# Patient Record
Sex: Female | Born: 1940 | Race: White | Hispanic: No | Marital: Married | Smoking: Former smoker
Health system: Southern US, Community
[De-identification: ages and names within clinical notes are randomized; demographics above are authoritative.]

## PROBLEM LIST (undated history)

## (undated) DIAGNOSIS — R7989 Other specified abnormal findings of blood chemistry: Secondary | ICD-10-CM

## (undated) DIAGNOSIS — F909 Attention-deficit hyperactivity disorder, unspecified type: Secondary | ICD-10-CM

## (undated) DIAGNOSIS — K8020 Calculus of gallbladder without cholecystitis without obstruction: Secondary | ICD-10-CM

## (undated) DIAGNOSIS — F418 Other specified anxiety disorders: Secondary | ICD-10-CM

## (undated) DIAGNOSIS — R945 Abnormal results of liver function studies: Secondary | ICD-10-CM

## (undated) DIAGNOSIS — R42 Dizziness and giddiness: Secondary | ICD-10-CM

## (undated) DIAGNOSIS — I8390 Asymptomatic varicose veins of unspecified lower extremity: Secondary | ICD-10-CM

## (undated) DIAGNOSIS — F4310 Post-traumatic stress disorder, unspecified: Secondary | ICD-10-CM

## (undated) DIAGNOSIS — R413 Other amnesia: Secondary | ICD-10-CM

## (undated) DIAGNOSIS — K219 Gastro-esophageal reflux disease without esophagitis: Secondary | ICD-10-CM

## (undated) DIAGNOSIS — I469 Cardiac arrest, cause unspecified: Secondary | ICD-10-CM

## (undated) DIAGNOSIS — C4491 Basal cell carcinoma of skin, unspecified: Secondary | ICD-10-CM

## (undated) DIAGNOSIS — T148XXA Other injury of unspecified body region, initial encounter: Secondary | ICD-10-CM

## (undated) HISTORY — PX: VARICOSE VEIN SURGERY: SHX832

## (undated) HISTORY — DX: Calculus of gallbladder without cholecystitis without obstruction: K80.20

## (undated) HISTORY — DX: Other amnesia: R41.3

## (undated) HISTORY — DX: Other specified abnormal findings of blood chemistry: R79.89

## (undated) HISTORY — DX: Attention-deficit hyperactivity disorder, unspecified type: F90.9

## (undated) HISTORY — PX: MOHS SURGERY: SHX181

## (undated) HISTORY — DX: Post-traumatic stress disorder, unspecified: F43.10

## (undated) HISTORY — PX: BACK SURGERY: SHX140

## (undated) HISTORY — DX: Other specified anxiety disorders: F41.8

## (undated) HISTORY — PX: ABDOMINAL HYSTERECTOMY: SHX81

## (undated) HISTORY — DX: Gastro-esophageal reflux disease without esophagitis: K21.9

## (undated) HISTORY — DX: Abnormal results of liver function studies: R94.5

## (undated) HISTORY — DX: Asymptomatic varicose veins of unspecified lower extremity: I83.90

## (undated) HISTORY — DX: Basal cell carcinoma of skin, unspecified: C44.91

## (undated) HISTORY — PX: CATARACT EXTRACTION: SUR2

## (undated) HISTORY — DX: Dizziness and giddiness: R42

---

## 1979-10-13 HISTORY — PX: CHOLECYSTECTOMY: SHX55

## 1982-10-12 HISTORY — PX: LIVER SURGERY: SHX698

## 2010-01-02 ENCOUNTER — Encounter: Payer: Self-pay | Admitting: Emergency Medicine

## 2010-01-02 ENCOUNTER — Observation Stay (HOSPITAL_COMMUNITY): Admission: EM | Admit: 2010-01-02 | Discharge: 2010-01-03 | Payer: Self-pay | Admitting: Internal Medicine

## 2010-01-02 ENCOUNTER — Ambulatory Visit: Payer: Self-pay | Admitting: Radiology

## 2010-01-02 ENCOUNTER — Ambulatory Visit: Payer: Self-pay | Admitting: Cardiology

## 2010-01-03 ENCOUNTER — Encounter: Payer: Self-pay | Admitting: Urology

## 2010-01-03 ENCOUNTER — Encounter (INDEPENDENT_AMBULATORY_CARE_PROVIDER_SITE_OTHER): Payer: Self-pay | Admitting: Internal Medicine

## 2010-01-03 ENCOUNTER — Ambulatory Visit: Payer: Self-pay | Admitting: Vascular Surgery

## 2011-01-05 LAB — LIPID PANEL
HDL: 46 mg/dL (ref >39)
LDL Cholesterol: 112 mg/dL — ABNORMAL HIGH (ref 0–99)
Total CHOL/HDL Ratio: 3.7 RATIO
Triglycerides: 51 mg/dL (ref <150)

## 2011-01-05 LAB — PROTIME-INR: Prothrombin Time: 13.2 seconds (ref 11.6–15.2)

## 2011-01-05 LAB — COMPREHENSIVE METABOLIC PANEL
ALT: 22 U/L (ref 0–35)
ALT: 29 U/L (ref 0–35)
Albumin: 3.6 g/dL (ref 3.5–5.2)
Alkaline Phosphatase: 85 U/L (ref 39–117)
CO2: 30 mEq/L (ref 19–32)
Calcium: 8.7 mg/dL (ref 8.4–10.5)
Chloride: 107 mEq/L (ref 96–112)
Creatinine, Ser: 0.66 mg/dL (ref 0.4–1.2)
Creatinine, Ser: 0.7 mg/dL (ref 0.4–1.2)
GFR calc non Af Amer: >60 mL/min (ref >60)
Glucose, Bld: 100 mg/dL — ABNORMAL HIGH (ref 70–99)
Sodium: 143 mEq/L (ref 135–145)
Sodium: 146 mEq/L — ABNORMAL HIGH (ref 135–145)
Total Bilirubin: 0.6 mg/dL (ref 0.3–1.2)
Total Protein: 6.5 g/dL (ref 6.0–8.3)
Total Protein: 7.2 g/dL (ref 6.0–8.3)

## 2011-01-05 LAB — CBC
HCT: 39.2 % (ref 36.0–46.0)
Hemoglobin: 13.7 g/dL (ref 12.0–15.0)
MCHC: 33.9 g/dL (ref 30.0–36.0)
MCV: 98.2 fL (ref 78.0–100.0)
Platelets: 210 10*3/uL (ref 150–400)
Platelets: 214 10*3/uL (ref 150–400)
RBC: 4.37 MIL/uL (ref 3.87–5.11)
WBC: 7 10*3/uL (ref 4.0–10.5)
WBC: 9 10*3/uL (ref 4.0–10.5)

## 2011-01-05 LAB — DIFFERENTIAL
Eosinophils Absolute: 0.1 10*3/uL (ref 0.0–0.7)
Eosinophils Relative: 1 % (ref 0–5)
Lymphs Abs: 1.1 10*3/uL (ref 0.7–4.0)
Monocytes Absolute: 0.4 10*3/uL (ref 0.1–1.0)
Neutro Abs: 3.4 10*3/uL (ref 1.7–7.7)
Neutro Abs: 7.4 10*3/uL (ref 1.7–7.7)

## 2011-01-05 LAB — CARDIAC PANEL(CRET KIN+CKTOT+MB+TROPI)
CK, MB: 2.8 ng/mL (ref 0.3–4.0)
Total CK: 742 U/L — ABNORMAL HIGH (ref 7–177)
Troponin I: <0.01 ng/mL (ref 0.00–0.06)

## 2011-01-05 LAB — BRAIN NATRIURETIC PEPTIDE: Pro B Natriuretic peptide (BNP): 103 pg/mL — ABNORMAL HIGH (ref 0.0–100.0)

## 2011-01-05 LAB — URINALYSIS, ROUTINE W REFLEX MICROSCOPIC
Bilirubin Urine: NEGATIVE
Ketones, ur: NEGATIVE mg/dL

## 2011-01-05 LAB — PHOSPHORUS: Phosphorus: 3.4 mg/dL (ref 2.3–4.6)

## 2011-03-31 ENCOUNTER — Encounter: Payer: Self-pay | Admitting: Internal Medicine

## 2011-05-13 ENCOUNTER — Ambulatory Visit (INDEPENDENT_AMBULATORY_CARE_PROVIDER_SITE_OTHER): Payer: Medicare Other | Admitting: Internal Medicine

## 2011-05-13 ENCOUNTER — Encounter: Payer: Self-pay | Admitting: Internal Medicine

## 2011-05-13 DIAGNOSIS — K219 Gastro-esophageal reflux disease without esophagitis: Secondary | ICD-10-CM

## 2011-05-13 DIAGNOSIS — R1310 Dysphagia, unspecified: Secondary | ICD-10-CM

## 2011-05-13 NOTE — Patient Instructions (Signed)
EGD [facility] 05/15/11 1:30 pm arrive at 12:30 pm on 4th floor Endoscopy brochure given along with GERD information for you to read. Dexilant 60 mg prescription sent to pharmacy and samples given.  Take 1 by mouth 30 mins. Prior to first meal.

## 2011-05-15 ENCOUNTER — Encounter: Payer: Self-pay | Admitting: Internal Medicine

## 2011-05-15 ENCOUNTER — Ambulatory Visit (AMBULATORY_SURGERY_CENTER): Payer: Medicare Other | Admitting: Internal Medicine

## 2011-05-15 VITALS — BP 143/67 | HR 56 | Temp 98.0°F | Resp 14 | Ht 65.0 in | Wt 135.0 lb

## 2011-05-15 DIAGNOSIS — K219 Gastro-esophageal reflux disease without esophagitis: Secondary | ICD-10-CM

## 2011-05-15 DIAGNOSIS — R1310 Dysphagia, unspecified: Secondary | ICD-10-CM

## 2011-05-15 MED ORDER — SODIUM CHLORIDE 0.9 % IV SOLN: 500.0000 mL | INTRAVENOUS | Status: DC

## 2011-05-15 NOTE — Progress Notes (Signed)
HISTORY OF PRESENT ILLNESS:  Autumn Lawson is a 70 y.o. female with the below listed medical history who presents today regarding reflux disease and dysphagia. Her history is remarkable for complicated stone-related biliary disease remotely for which she apparently underwent choledocho duodenostomy and hepaticojejunostomy in addition to repeat ERCPs (all at [facility]). No problems for years and that regard. Also, has had repeat screening colonoscopies in [facility]. She denies polyps. Her current history, for which she is referred by her primary provider, is that of 2 years of pharyngeal burning exacerbated by certain food items such as spicy food and wine. This seems to be low-grade chronic burning sensation. She take Prilosec 20 mg daily. She uses antacids on command which afford some relief. She also reports a 2 year history of intermittent solid food dysphagia. No food impaction. For these problems, she was evaluated at [facility]. PH study suggested. She was not interested. I do have a portion of upper endoscopy report from [place] date 03/21/2009. Telemetry had gastritis as well as surgically altered anatomy. Biopsies did not show H. pylori.  REVIEW OF SYSTEMS:  All non-GI ROS negative except for  Past Medical History  Diagnosis Date  . ADD (attention deficit disorder with hyperactivity)   . Depression with anxiety   . Vertigo   . PTSD (post-traumatic stress disorder)   . Esophageal reflux   . Gallstone   . Skin cancer, basal cell   . Elevated LFTs     Past Surgical History  Procedure Date  . Varicose vein surgery   . Cesarean section     x4  . Cataract extraction     bilateral  . Abdominal hysterectomy   . Back surgery     rod in back placed  . Liver surgery 1984  . Cholecystectomy 1981    Social History Autumn Lawson  reports that she has quit smoking. She does not have any smokeless tobacco history on file. She reports that she does not drink  alcohol or use illicit drugs.  family history includes Alzheimer's disease in her mother; Leukemia in her father; and Prostate cancer in her other.  Allergies  Allergen Reactions  . Red Wine Extract        PHYSICAL EXAMINATION: Vital signs: BP 140/70  Pulse 60  Ht 5\' 5"  (1.651 m)  Wt 135 lb 6.4 oz (61.417 kg)  BMI 22.53 kg/m2  Constitutional: generally well-appearing, no acute distress Psychiatric: alert and oriented x3, cooperative Eyes: extraocular movements intact, anicteric, conjunctiva pink Mouth: oral pharynx moist, no lesions Neck: supple no lymphadenopathy Cardiovascular: heart regular rate and rhythm, no murmur Lungs: clear to auscultation bilaterally Abdomen: soft, nontender, nondistended, no obvious ascites, no peritoneal signs, normal bowel sounds, no organomegaly. Surgical incisions well-healed Extremities: no lower extremity edema bilaterally Skin: no lesions on visible extremities Neuro: No focal deficits.   ASSESSMENT:  #1. Intermittent solid food dysphagia. Rule out stricture or ring #2. Pharyngeal burning and substernal burning despite Prilosec 20 mg daily. Some improvement with antacids. Query GERD #3. Remote history of complicated biliary stone disease status post choledochoduodenostomy, hepaticojejunostomy, multiple ERCPs #4. Colon cancer screening. Prior colonoscopies in [place]   PLAN:  #1. Reflux precautions #2. Change from Prilosec to Dexilant 60 mg daily. Samples provided and a prescription submitted electronically #3. Schedule upper endoscopy with probable esophageal dilation.The nature of the procedure, as well as the risks, benefits, and alternatives were carefully and thoroughly reviewed with the patient. Ample time for discussion and questions  allowed. The patient understood, was satisfied, and agreed to proceed.  #4. Release form signed to obtain outside records for review

## 2011-05-15 NOTE — Progress Notes (Signed)
No complaints on discharge.  MAW 

## 2011-05-15 NOTE — Patient Instructions (Signed)
Refer to the picture page for the findings of the upper endoscopy.  Please follow the esophageal dilation diet the remainder of the day.  Please follow the discharge instructions on the green and blue sheets the rest of the day also.  Resume your prior medications today.  Call if any questions or concerns.

## 2011-05-16 ENCOUNTER — Other Ambulatory Visit: Payer: Self-pay | Admitting: Internal Medicine

## 2011-05-16 MED ORDER — DEXLANSOPRAZOLE 60 MG PO CPDR: 60.0000 mg | DELAYED_RELEASE_CAPSULE | Freq: Every day | ORAL | Status: AC

## 2011-05-16 NOTE — Telephone Encounter (Signed)
dexilant not at pharmacy Asking me to send Rx to [facility] in [facility] Will do 1 month She will need further refills via local pharmacy

## 2011-05-17 NOTE — Telephone Encounter (Signed)
Britta Mccreedy, I thought that you submitted a prescription for Dexilant to this patient's pharmacy, as I had directed in the office -  And as indicated in your patient instructions... Please clarify. Also, send 6 refills - if not already done (see Dr. Marvell Fuller note from this weekend). Thanks.

## 2011-05-18 ENCOUNTER — Telehealth: Payer: Self-pay | Admitting: *Deleted

## 2011-05-18 NOTE — Telephone Encounter (Signed)
Called number in computer and no answer with no identifiers so no message left. EWM

## 2011-05-18 NOTE — Telephone Encounter (Signed)
Rx. Was sent to pharmacy for Dexilant while patient was in the office.  #30 with no refills when it should have been 6 RFs,  Dexilant resent with 6 RFs.

## 2011-05-19 ENCOUNTER — Telehealth: Payer: Self-pay | Admitting: Internal Medicine

## 2011-05-19 NOTE — Telephone Encounter (Signed)
Forwarded to Dr. Marina Goodell for review. Patient's appt on 06-22-11

## 2011-05-28 ENCOUNTER — Telehealth: Payer: Self-pay | Admitting: Internal Medicine

## 2011-05-28 NOTE — Telephone Encounter (Signed)
Received copies from Dr. Donnal Debar- Dept. Of Gastroenterology at [facility] on 05/28/11. Forwarded  5pages to Dr.Perry for review.

## 2011-06-22 ENCOUNTER — Other Ambulatory Visit: Payer: Self-pay | Admitting: *Deleted

## 2011-06-22 ENCOUNTER — Ambulatory Visit: Payer: Medicare Other | Admitting: Internal Medicine

## 2011-06-22 MED ORDER — DEXLANSOPRAZOLE 60 MG PO CPDR: 60.0000 mg | DELAYED_RELEASE_CAPSULE | Freq: Every day | ORAL | Status: AC

## 2011-06-22 NOTE — Telephone Encounter (Signed)
Dexilant sent to pharmacy 

## 2011-07-02 ENCOUNTER — Encounter: Payer: Self-pay | Admitting: Internal Medicine

## 2011-07-02 ENCOUNTER — Ambulatory Visit (INDEPENDENT_AMBULATORY_CARE_PROVIDER_SITE_OTHER): Payer: Medicare Other | Admitting: Internal Medicine

## 2011-07-02 DIAGNOSIS — Z1211 Encounter for screening for malignant neoplasm of colon: Secondary | ICD-10-CM

## 2011-07-02 DIAGNOSIS — J312 Chronic pharyngitis: Secondary | ICD-10-CM

## 2011-07-02 DIAGNOSIS — K219 Gastro-esophageal reflux disease without esophagitis: Secondary | ICD-10-CM

## 2011-07-02 DIAGNOSIS — R1310 Dysphagia, unspecified: Secondary | ICD-10-CM

## 2011-07-02 NOTE — Patient Instructions (Signed)
Follow up as needed

## 2011-07-02 NOTE — Progress Notes (Signed)
HISTORY OF PRESENT ILLNESS:  Autumn Lawson is a 70 y.o. female with the below listed medical history who was evaluated 05/13/2011 regarding possible reflux disease and dysphagia. See that dictation for details. She subsequently underwent upper endoscopy on 05/15/2011. The examination was normal except for the presence of a small sliding hiatal hernia and evidence of prior choledochoduodenostomy. The esophagus was empirically dilated with a 54 [place] Maloney dilator. She was placed on Dexilant 60 mg daily and asked to followup at this time. Since her procedure, she still complains of the throat burning sensation. She describes "swollen glands". She was compliant with Dexilant therapy. Her intermittent solid food dysphagia has resolved. Unable to obtain old colonoscopy records, but reports on prior examinations, no polyps. She estimates most recent examination 3 years ago. She wishes to have future surveillance exams here.  REVIEW OF SYSTEMS:  All non-GI ROS negative.  Past Medical History  Diagnosis Date  . ADD (attention deficit disorder with hyperactivity)   . Depression with anxiety   . Vertigo   . PTSD (post-traumatic stress disorder)   . Esophageal reflux   . Gallstone   . Skin cancer, basal cell   . Elevated LFTs     Past Surgical History  Procedure Date  . Varicose vein surgery   . Cesarean section     x4  . Cataract extraction     bilateral  . Abdominal hysterectomy   . Back surgery     rod in back placed  . Liver surgery 1984  . Cholecystectomy 1981    Social History Autumn Lawson  reports that she quit smoking about 35 years ago. She does not have any smokeless tobacco history on file. She reports that she does not drink alcohol or use illicit drugs.  family history includes Alzheimer's disease in her mother; Leukemia in her father; and Prostate cancer in her other.  Allergies  Allergen Reactions  . Red Wine Extract        PHYSICAL EXAMINATION:  Vital signs:  BP 112/68  Pulse 57  Ht 5\' 5"  (1.651 m)  Wt 134 lb (60.782 kg)  BMI 22.30 kg/m2  SpO2 98% General: Well-developed, well-nourished, no acute distress HEENT: Sclerae are anicteric, conjunctiva pink. Oral mucosa intact Lungs: Clear Heart: Regular Abdomen: soft, nontender, nondistended, no obvious ascites, no peritoneal signs, normal bowel sounds. No organomegaly. Extremities: No edema Psychiatric: alert and oriented x3. Cooperative    ASSESSMENT:  #1. 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 No evidence to support GERD a history, endoscopic exam, or response to appropriate therapy. #2. Intermittent solid food dysphagia. Improve post dilation. Suspect subtle ring or web adequately treated with recent dilation. #3. Query GERD #4. Colon cancer screening. No prior history of polyps. No family history. Reports last exam, elsewhere, about 3 years ago   PLAN:  #1. Discontinue Dexilant #2. Would not use chronic PPI if there is no appreciable benefit. Could use on demand if it seems helpful for dyspeptic symptoms. Prilosec OTC would be acceptable. I discussed this with her #3. Return to primary provider regarding further assessment and workup of pharyngeal burning. Could obtain an ENT or allergy opinion. I will leave this to her PCP #4. We will set colonoscopy recall for 10 years from last exam, or 2019. #5. Interval GI followup as needed

## 2015-03-13 DIAGNOSIS — R42 Dizziness and giddiness: Secondary | ICD-10-CM

## 2015-03-13 HISTORY — DX: Dizziness and giddiness: R42

## 2015-03-20 ENCOUNTER — Encounter (HOSPITAL_COMMUNITY): Payer: Self-pay | Admitting: *Deleted

## 2015-03-20 ENCOUNTER — Emergency Department (HOSPITAL_COMMUNITY): Payer: Medicare Other

## 2015-03-20 ENCOUNTER — Inpatient Hospital Stay (HOSPITAL_COMMUNITY)
Admission: EM | Admit: 2015-03-20 | Discharge: 2015-03-23 | DRG: 087 | Disposition: A | Discharge status: Home / Self Care | Payer: Medicare Other | Attending: Internal Medicine | Admitting: Internal Medicine

## 2015-03-20 DIAGNOSIS — Z9842 Cataract extraction status, left eye: Secondary | ICD-10-CM

## 2015-03-20 DIAGNOSIS — S065X0A Traumatic subdural hemorrhage without loss of consciousness, initial encounter: Secondary | ICD-10-CM | POA: Diagnosis not present

## 2015-03-20 DIAGNOSIS — Z85828 Personal history of other malignant neoplasm of skin: Secondary | ICD-10-CM

## 2015-03-20 DIAGNOSIS — Z9841 Cataract extraction status, right eye: Secondary | ICD-10-CM

## 2015-03-20 DIAGNOSIS — W19XXXA Unspecified fall, initial encounter: Secondary | ICD-10-CM | POA: Diagnosis present

## 2015-03-20 DIAGNOSIS — R2681 Unsteadiness on feet: Secondary | ICD-10-CM | POA: Diagnosis not present

## 2015-03-20 DIAGNOSIS — S065XAA Traumatic subdural hemorrhage with loss of consciousness status unknown, initial encounter: Secondary | ICD-10-CM | POA: Diagnosis present

## 2015-03-20 DIAGNOSIS — F329 Major depressive disorder, single episode, unspecified: Secondary | ICD-10-CM | POA: Diagnosis not present

## 2015-03-20 DIAGNOSIS — Z87891 Personal history of nicotine dependence: Secondary | ICD-10-CM

## 2015-03-20 DIAGNOSIS — F32A Depression, unspecified: Secondary | ICD-10-CM | POA: Diagnosis present

## 2015-03-20 DIAGNOSIS — Z888 Allergy status to other drugs, medicaments and biological substances status: Secondary | ICD-10-CM

## 2015-03-20 DIAGNOSIS — R42 Dizziness and giddiness: Secondary | ICD-10-CM

## 2015-03-20 DIAGNOSIS — K219 Gastro-esophageal reflux disease without esophagitis: Secondary | ICD-10-CM | POA: Diagnosis present

## 2015-03-20 DIAGNOSIS — Z90710 Acquired absence of both cervix and uterus: Secondary | ICD-10-CM

## 2015-03-20 DIAGNOSIS — F4310 Post-traumatic stress disorder, unspecified: Secondary | ICD-10-CM | POA: Diagnosis present

## 2015-03-20 DIAGNOSIS — R296 Repeated falls: Secondary | ICD-10-CM | POA: Diagnosis present

## 2015-03-20 DIAGNOSIS — Z9049 Acquired absence of other specified parts of digestive tract: Secondary | ICD-10-CM | POA: Diagnosis present

## 2015-03-20 DIAGNOSIS — F909 Attention-deficit hyperactivity disorder, unspecified type: Secondary | ICD-10-CM | POA: Diagnosis present

## 2015-03-20 DIAGNOSIS — I951 Orthostatic hypotension: Secondary | ICD-10-CM | POA: Diagnosis present

## 2015-03-20 DIAGNOSIS — S065X9A Traumatic subdural hemorrhage with loss of consciousness of unspecified duration, initial encounter: Secondary | ICD-10-CM

## 2015-03-20 DIAGNOSIS — F418 Other specified anxiety disorders: Secondary | ICD-10-CM | POA: Diagnosis present

## 2015-03-20 DIAGNOSIS — I6200 Nontraumatic subdural hemorrhage, unspecified: Secondary | ICD-10-CM | POA: Diagnosis not present

## 2015-03-20 HISTORY — DX: Other injury of unspecified body region, initial encounter: T14.8XXA

## 2015-03-20 LAB — URINALYSIS, ROUTINE W REFLEX MICROSCOPIC
BILIRUBIN URINE: NEGATIVE
Glucose, UA: NEGATIVE mg/dL
Hgb urine dipstick: NEGATIVE
KETONES UR: NEGATIVE mg/dL
NITRITE: NEGATIVE
Protein, ur: NEGATIVE mg/dL
Specific Gravity, Urine: 1.014 (ref 1.005–1.030)
UROBILINOGEN UA: 0.2 mg/dL (ref 0.0–1.0)
pH: 7 (ref 5.0–8.0)

## 2015-03-20 LAB — BASIC METABOLIC PANEL
Anion gap: 8 (ref 5–15)
BUN: 15 mg/dL (ref 6–20)
CO2: 25 mmol/L (ref 22–32)
CREATININE: 0.69 mg/dL (ref 0.44–1.00)
Calcium: 9.2 mg/dL (ref 8.9–10.3)
Chloride: 107 mmol/L (ref 101–111)
GLUCOSE: 98 mg/dL (ref 65–99)
Potassium: 3.6 mmol/L (ref 3.5–5.1)
Sodium: 140 mmol/L (ref 135–145)

## 2015-03-20 LAB — CBC WITH DIFFERENTIAL/PLATELET
BASOS ABS: 0 10*3/uL (ref 0.0–0.1)
Basophils Relative: 0 % (ref 0–1)
EOS ABS: 0.2 10*3/uL (ref 0.0–0.7)
EOS PCT: 2 % (ref 0–5)
HCT: 42.6 % (ref 36.0–46.0)
HEMOGLOBIN: 14.2 g/dL (ref 12.0–15.0)
Lymphocytes Relative: 30 % (ref 12–46)
Lymphs Abs: 2.2 10*3/uL (ref 0.7–4.0)
MCH: 32.6 pg (ref 26.0–34.0)
MCHC: 33.3 g/dL (ref 30.0–36.0)
MCV: 97.7 fL (ref 78.0–100.0)
Monocytes Absolute: 0.6 10*3/uL (ref 0.1–1.0)
Monocytes Relative: 8 % (ref 3–12)
NEUTROS ABS: 4.5 10*3/uL (ref 1.7–7.7)
NEUTROS PCT: 60 % (ref 43–77)
Platelets: 224 10*3/uL (ref 150–400)
RBC: 4.36 MIL/uL (ref 3.87–5.11)
RDW: 12.8 % (ref 11.5–15.5)
WBC: 7.5 10*3/uL (ref 4.0–10.5)

## 2015-03-20 LAB — PROTIME-INR
INR: 1.02 (ref 0.00–1.49)
Prothrombin Time: 13.6 seconds (ref 11.6–15.2)

## 2015-03-20 LAB — URINE MICROSCOPIC-ADD ON

## 2015-03-20 NOTE — ED Notes (Signed)
MD at bedside. Neurology

## 2015-03-20 NOTE — ED Notes (Signed)
Pt fell 2-3 weeks ago, hit head. PCP ordered MRI, had it yesterday, got results last night and was told had a bleed (unsure if old or new). Told to follow up with neurologist. Reports continual headache, here for further evaluation. Has disc from MRI with her.

## 2015-03-20 NOTE — ED Notes (Signed)
Per dr. Laneta Simmers CT was D/C and MRI disc is in process of being uploaded.

## 2015-03-20 NOTE — H&P (Addendum)
[facility] History and Physical  Autumn Lawson PGF:[id number] DOB: October 08, 1941 DOA: 03/20/2015   PCP: Andres Shad, MD  Specialists: None  Chief Complaint: Bleeding in the brain  HPI: Autumn Lawson is a 74 y.o. female with a past medical history of depression, but otherwise no other health problems who came in after MRI done yesterday showed evidence for subdural hematoma. Patient is accompanied by her husband who provided a lot of history. Apparently, patient slipped and fell back in December 2015. She took a while to recover from that head injury. And then 2 weeks ago she had multiple episodes of nausea, vomiting during the day and then she woke up the next morning with a bruised right eye. Does not remember if she actually fell. Since then she has had unsteady gait. Has had symptoms suggestive of vertigo. No real vision problems. Decreased mobility, decreased oral intake. No further falls. No fever, chills. No focal weakness on any one side of the body. No syncopal episodes. No diarrheas recently. She does feel the headache in the back of the head. Certain movements make her dizzy. She has been walking in her house holding onto furniture and walls. She went to her primary care provider with these complaints and MRI brain was ordered which was done in [place]. This showed a subdural hematoma in the right frontal area. Apparently she was being set up for outpatient follow-up, but the patient and family got concerned due to her impaired mobility and then decided to come into the hospital. She has been seen by neurosurgery who recommends observation.  Home Medications: Prior to Admission medications   Medication Sig Start Date End Date Taking? Authorizing Provider  AMBULATORY NON FORMULARY MEDICATION BioIdentical Hormones cream apply once a day    Yes Historical Provider, MD  b complex vitamins tablet Take 1 tablet by mouth 3 (three) times a week.     Yes Historical Provider, MD    CALCIUM-MAG-VIT C-VIT D PO Take 6 capsules by mouth daily.     Yes Historical Provider, MD  fish oil-omega-3 fatty acids 1000 MG capsule Take 3 by mouth once a day    Yes Historical Provider, MD  OVER THE COUNTER MEDICATION Take 1 tablet by mouth at bedtime. "Bone Restore" supplement   Yes Historical Provider, MD  PARoxetine (PAXIL) 10 MG tablet Take 5 mg by mouth every morning.    Yes Historical Provider, MD  Pomegranate, Punica granatum, (POMEGRANATE PO) Take 1 capsule by mouth daily as needed (supplement).   Yes Historical Provider, MD  psyllium (METAMUCIL SMOOTH TEXTURE) 28 % packet Take 1 packet by mouth at bedtime.   Yes Historical Provider, MD  sucralfate (CARAFATE) 1 G tablet Take 1 g by mouth as needed.      Historical Provider, MD  tretinoin (RETIN-A) 0.05 % cream Apply topically at bedtime.      Historical Provider, MD    Allergies:  Allergies  Allergen Reactions  . Levaquin [Levofloxacin In D5w] Other (See Comments)    Joint swelling    Past Medical History: Past Medical History  Diagnosis Date  . ADD (attention deficit disorder with hyperactivity)   . Depression with anxiety   . Vertigo   . PTSD (post-traumatic stress disorder)   . Esophageal reflux   . Gallstone   . Skin cancer, basal cell   . Elevated LFTs     Past Surgical History  Procedure Laterality Date  . Varicose vein surgery    . Cesarean section  x4  . Cataract extraction      bilateral  . Abdominal hysterectomy    . Back surgery      rod in back placed  . Liver surgery  1984  . Cholecystectomy  1981    Social History: Lives in [place] with her husband. Quit smoking 40 years ago, smoked for about 10 years. One drink of wine every night. No illicit drug use. Usually independent with daily activities but not so much in the last couple of weeks.  Family History:  Family History  Problem Relation Age of Onset  . Leukemia Father   . Prostate cancer Other     paternal [place] grandfather  .  Alzheimer's disease Mother      Review of Systems - History obtained from the patient General ROS: positive for  - fatigue Psychological ROS: negative Ophthalmic ROS: negative ENT ROS: negative Allergy and Immunology ROS: negative Hematological and Lymphatic ROS: negative Endocrine ROS: negative Respiratory ROS: no cough, shortness of breath, or wheezing Cardiovascular ROS: no chest pain or dyspnea on exertion Gastrointestinal ROS: no abdominal pain, change in bowel habits, or black or bloody stools Genito-Urinary ROS: She has noticed increased frequency of urination Musculoskeletal ROS: negative Neurological ROS: as in hpi Dermatological ROS: negative  Physical Examination  Filed Vitals:   03/20/15 2000 03/20/15 2001 03/20/15 2015 03/20/15 2030  BP: 153/68 153/68 140/64 151/73  Pulse: 80 75 68 73  Temp:      TempSrc:      Resp: '15 18 15 16  ' Height:      Weight:      SpO2: 97% 97% 95% 97%    BP 151/73 mmHg  Pulse 73  Temp(Src) 97.7 F (36.5 C) (Oral)  Resp 16  Ht '5\' 4"'  (1.626 m)  Wt 59.875 kg (132 lb)  BMI 22.65 kg/m2  SpO2 97%  General appearance: alert, cooperative, appears stated age and no distress Head: Normocephalic, without obvious abnormality, atraumatic Eyes: Mild bruising around the right, much better than what it was 2 weeks ago per patient. Throat: lips, mucosa, and tongue normal; teeth and gums normal Neck: no adenopathy, no carotid bruit, no JVD, supple, symmetrical, trachea midline and thyroid not enlarged, symmetric, no tenderness/mass/nodules Back: symmetric, no curvature. ROM normal. No CVA tenderness. Resp: clear to auscultation bilaterally Cardio: regular rate and rhythm, S1, S2 normal, no murmur, click, rub or gallop GI: soft, non-tender; bowel sounds normal; no masses,  no organomegaly Extremities: extremities normal, atraumatic, no cyanosis or edema Pulses: 2+ and symmetric Skin: Skin color, texture, turgor normal. No rashes or  lesions Lymph nodes: Cervical, supraclavicular, and axillary nodes normal. Neurologic: Alert. Oriented 3. Cranial nerves II-12 intact. Motor strength equal bilateral upper and lower extremities. No pronator drift.  Laboratory Data: Results for orders placed or performed during the hospital encounter of 03/20/15 (from the past 48 hour(s))  Urinalysis, Routine w reflex microscopic (not at [facility])     Status: Abnormal   Collection Time: 03/20/15  6:24 PM  Result Value Ref Range   Color, Urine YELLOW YELLOW   APPearance CLEAR CLEAR   Specific Gravity, Urine 1.014 1.005 - 1.030   pH 7.0 5.0 - 8.0   Glucose, UA NEGATIVE NEGATIVE mg/dL   Hgb urine dipstick NEGATIVE NEGATIVE   Bilirubin Urine NEGATIVE NEGATIVE   Ketones, ur NEGATIVE NEGATIVE mg/dL   Protein, ur NEGATIVE NEGATIVE mg/dL   Urobilinogen, UA 0.2 0.0 - 1.0 mg/dL   Nitrite NEGATIVE NEGATIVE   Leukocytes, UA TRACE (A)  NEGATIVE  Urine microscopic-add on     Status: None   Collection Time: 03/20/15  6:24 PM  Result Value Ref Range   Squamous Epithelial / LPF RARE RARE   WBC, UA 0-2 <3 WBC/hpf  Basic metabolic panel     Status: None   Collection Time: 03/20/15  6:43 PM  Result Value Ref Range   Sodium 140 135 - 145 mmol/L   Potassium 3.6 3.5 - 5.1 mmol/L   Chloride 107 101 - 111 mmol/L   CO2 25 22 - 32 mmol/L   Glucose, Bld 98 65 - 99 mg/dL   BUN 15 6 - 20 mg/dL   Creatinine, Ser 0.69 0.44 - 1.00 mg/dL   Calcium 9.2 8.9 - 10.3 mg/dL   GFR calc non Af Amer >60 >60 mL/min   GFR calc Af Amer >60 >60 mL/min    Comment: (NOTE) The eGFR has been calculated using the CKD EPI equation. This calculation has not been validated in all clinical situations. eGFR's persistently <60 mL/min signify possible Chronic Kidney Disease.    Anion gap 8 5 - 15  CBC with Differential     Status: None   Collection Time: 03/20/15  6:43 PM  Result Value Ref Range   WBC 7.5 4.0 - 10.5 K/uL   RBC 4.36 3.87 - 5.11 MIL/uL   Hemoglobin 14.2 12.0 -  15.0 g/dL   HCT 42.6 36.0 - 46.0 %   MCV 97.7 78.0 - 100.0 fL   MCH 32.6 26.0 - 34.0 pg   MCHC 33.3 30.0 - 36.0 g/dL   RDW 12.8 11.5 - 15.5 %   Platelets 224 150 - 400 K/uL   Neutrophils Relative % 60 43 - 77 %   Neutro Abs 4.5 1.7 - 7.7 K/uL   Lymphocytes Relative 30 12 - 46 %   Lymphs Abs 2.2 0.7 - 4.0 K/uL   Monocytes Relative 8 3 - 12 %   Monocytes Absolute 0.6 0.1 - 1.0 K/uL   Eosinophils Relative 2 0 - 5 %   Eosinophils Absolute 0.2 0.0 - 0.7 K/uL   Basophils Relative 0 0 - 1 %   Basophils Absolute 0.0 0.0 - 0.1 K/uL  Protime-INR     Status: None   Collection Time: 03/20/15  6:43 PM  Result Value Ref Range   Prothrombin Time 13.6 11.6 - 15.2 seconds   INR 1.02 0.00 - 1.49    Radiology Reports: Ct Head Wo Contrast  03/20/2015   CLINICAL DATA:  Patient fell 2-3 weeks ago. Patient is poor historian. Patient is anticoagulated. Cognitive decline with confusion.  EXAM: CT HEAD WITHOUT CONTRAST  TECHNIQUE: Contiguous axial images were obtained from the base of the skull through the vertex without intravenous contrast.  COMPARISON:  Outside MRI from [place]. This was performed 03/19/2015.  FINDINGS: There is a mixed attenuation subdural collection over the RIGHT frontal lobe. The collection is predominantly hypoattenuating suggesting chronicity. In the more dependent portion, near the pterion, the collection becomes iso to hyperattenuating. As measured on image 22 series 201, thickness is 7 mm. Slight flattening of the RIGHT frontal gyri. Minimal effacement of the RIGHT medial interhemispheric CSF. Midline shift estimated 2 mm as measured at the septum pellucidum.  Generalized atrophy. Chronic microvascular ischemic change of a moderate to advance nature. No acute cortical infarction. Extracranial soft tissues unremarkable. No skull fracture specifically.  IMPRESSION: Mixed attenuation subdural collection over the RIGHT frontal lobe consistent with a predominantly chronic subdural hematoma. 7  mm  thickness. Similar appearance to yesterday's MR. Minimal right-to-left shift of 2 mm. Continued surveillance is warranted.   Electronically Signed   By: Rolla Flatten M.D.   On: 03/20/2015 19:47     Problem List  Principal Problem:   SDH (subdural hematoma) Active Problems:   Depression   Unsteady gait   Vertigo   Assessment: This is a 74 year old Caucasian female who presents with unsteady gait, headache, vertiginous symptoms and has a subdural hematoma. Etiology for this is not entirely clear. No concrete reports of falls or injuries. This could explain all of her symptoms. She'll be observed overnight per neurosurgery recommendations and will also need to be seen by physical and occupational therapy as it is quite likely she will need some form of rehabilitation.  Plan: #1 Subdural hematoma: CT head has been done which shows stable findings. Rest of the plan per neurosurgery. No surgery is planned in this patient. Neurosurgery note mentions Coumadin. However, patient is not on Coumadin currently.  #2 Unsteady gait, vertiginous symptoms: Most likely secondary to the above. Involve physical and occupational therapy. Meclizine might help, but will need to discuss this with the consultant before initiating. Will also see first how she does with physical therapy.  #3 history of depression: Continue with her antidepressant  Did mention frequency of urination. UA, however, does not suggest UTI.  DVT Prophylaxis: SCDs Code Status: Full code Family Communication: Discussed with the patient and her husband  Disposition Plan: Observe to telemetry   Further management decisions will depend on results of further testing and patient's response to treatment.   [facility]  [facility] Pager [phone]  If 7PM-7AM, please contact night-coverage [email or web address] Password [facility]  03/20/2015, 8:36 PM

## 2015-03-20 NOTE — ED Notes (Signed)
Family notified house coverage--- Dr. Randell Loop--- [phone] [place] Maize   [phone]  Family does not want pt to go home, would like further assessment for possible assisted living placement-- social work consult/case management consult.  If pt is discharged today -- please notify son-in- law  (Dr. Nelva Bush) prior to discharge.

## 2015-03-20 NOTE — Consult Note (Addendum)
Reason for Consult:skull fracture, traumatic [place] Referring Physician: ED  Autumn Lawson is an 74 y.o. female.  HPI: whom has had multiple falls, and according to family members is a poor historian. She was sent by her Primary care physician for an MRI today at [facility]. The MRI revealed a subdural hematoma with a minimal amount of right to left shift ~2.30mm.   Past Medical History  Diagnosis Date  . ADD (attention deficit disorder with hyperactivity)   . Depression with anxiety   . Vertigo   . PTSD (post-traumatic stress disorder)   . Esophageal reflux   . Gallstone   . Skin cancer, basal cell   . Elevated LFTs     Past Surgical History  Procedure Laterality Date  . Varicose vein surgery    . Cesarean section      x4  . Cataract extraction      bilateral  . Abdominal hysterectomy    . Back surgery      rod in back placed  . Liver surgery  1984  . Cholecystectomy  1981    Family History  Problem Relation Age of Onset  . Leukemia Father   . Prostate cancer Other     paternal [place] grandfather  . Alzheimer's disease Mother     Social History:  reports that she quit smoking about 38 years ago. She does not have any smokeless tobacco history on file. She reports that she does not drink alcohol or use illicit drugs.  Allergies:  Allergies  Allergen Reactions  . Levaquin [Levofloxacin In D5w] Other (See Comments)    Joint swelling    Medications: I have reviewed the patient's current medications.  No results found for this or any previous visit (from the past 48 hour(s)).  No results found.  Review of Systems  Constitutional: Negative.   Musculoskeletal: Positive for falls.  Skin: Negative.   Neurological:       Cognitive impairment   Blood pressure 130/92, pulse 79, temperature 97.7 F (36.5 C), temperature source Oral, resp. rate 14, height 5\' 4"  (1.626 m), weight 59.875 kg (132 lb), SpO2 99 %. Physical Exam  Constitutional: She is oriented to person,  place, and time. She appears well-developed and well-nourished. No distress.  HENT:  Head: Normocephalic and atraumatic.  Right Ear: External ear normal.  Left Ear: External ear normal.  Nose: Nose normal.  Mouth/Throat: Oropharynx is clear and moist.  Eyes: Conjunctivae and EOM are normal. Pupils are equal, round, and reactive to light.  Neck: Normal range of motion. Neck supple.  Cardiovascular: Normal rate, regular rhythm, normal heart sounds and intact distal pulses.   Respiratory: Effort normal and breath sounds normal.  GI: Soft. Bowel sounds are normal.  Musculoskeletal: Normal range of motion.  Neurological: She is alert and oriented to person, place, and time. She has normal reflexes. She is not disoriented. She displays normal reflexes. No cranial nerve deficit or sensory deficit. She exhibits normal muscle tone. GCS eye subscore is 4. GCS verbal subscore is 5. GCS motor subscore is 6. She displays no Babinski's sign on the right side. She displays no Babinski's sign on the left side.  Reflex Scores:      Tricep reflexes are 2+ on the right side and 2+ on the left side.      Bicep reflexes are 2+ on the right side and 2+ on the left side.      Brachioradialis reflexes are 2+ on the right side and 2+ on the  left side.      Patellar reflexes are 2+ on the right side and 2+ on the left side.      Achilles reflexes are 2+ on the right side and 2+ on the left side. Proprioception intact Did not assess gait,  Speech is clear, and fluent  Skin: Skin is warm and dry.  Psychiatric: She has a normal mood and affect. Her behavior is normal. Thought content normal.    Assessment/Plan: Admit for observation, repeat CT scan.  Will stop coumadin Doing well currently, confused, family believes she is slightly worse than usual cognitively  Jumanah Hynson L 03/20/2015, 6:00 PM

## 2015-03-20 NOTE — ED Provider Notes (Signed)
CSN: [id number]     Arrival date & time 03/20/15  1423 History   None    Chief Complaint  Patient presents with  . Headache     (Consider location/radiation/quality/duration/timing/severity/associated sxs/prior Treatment) Patient is a 74 y.o. female presenting with headaches. The history is provided by the patient.  Headache Pain location:  Occipital Quality:  Dull (pressure) Radiates to:  Does not radiate Onset quality:  Gradual Timing:  Intermittent Progression:  Waxing and waning Chronicity:  New Similar to prior headaches: no   Context: activity   Associated symptoms: dizziness, fatigue, loss of balance, nausea and paresthesias (of head)   Associated symptoms: no blurred vision, no eye pain, no fever, no hearing loss, no near-syncope, no neck pain, no numbness, no syncope, no vomiting and no weakness   Associated symptoms comment:  Vertigo Risk factors comment:  Head injury in December, another 2 weeks ago while feeling ill and head hit toilet resulting in right black eye, taking "reserveritrol"   Past Medical History  Diagnosis Date  . ADD (attention deficit disorder with hyperactivity)   . Depression with anxiety   . Vertigo   . PTSD (post-traumatic stress disorder)   . Esophageal reflux   . Gallstone   . Skin cancer, basal cell   . Elevated LFTs    Past Surgical History  Procedure Laterality Date  . Varicose vein surgery    . Cesarean section      x4  . Cataract extraction      bilateral  . Abdominal hysterectomy    . Back surgery      rod in back placed  . Liver surgery  1984  . Cholecystectomy  1981   Family History  Problem Relation Age of Onset  . Leukemia Father   . Prostate cancer Other     paternal [place] grandfather  . Alzheimer's disease Mother    History  Substance Use Topics  . Smoking status: Former Smoker    Quit date: 05/14/1976  . Smokeless tobacco: Not on file  . Alcohol Use: No   OB History    No data available     Review of  Systems  Constitutional: Positive for fatigue. Negative for fever.  HENT: Negative for hearing loss.   Eyes: Negative for blurred vision and pain.  Cardiovascular: Negative for syncope and near-syncope.  Gastrointestinal: Positive for nausea. Negative for vomiting.  Musculoskeletal: Negative for neck pain.  Neurological: Positive for dizziness, headaches, paresthesias (of head) and loss of balance. Negative for weakness and numbness.  All other systems reviewed and are negative.     Allergies  Levaquin  Home Medications   Prior to Admission medications   Medication Sig Start Date End Date Taking? Authorizing Provider  AMBULATORY NON FORMULARY MEDICATION BioIdentical Hormones cream apply once a day    Yes Historical Provider, MD  b complex vitamins tablet Take 1 tablet by mouth 3 (three) times a week.     Yes Historical Provider, MD  CALCIUM-MAG-VIT C-VIT D PO Take 6 capsules by mouth daily.     Yes Historical Provider, MD  fish oil-omega-3 fatty acids 1000 MG capsule Take 3 by mouth once a day    Yes Historical Provider, MD  OVER THE COUNTER MEDICATION Take 1 tablet by mouth at bedtime. "Bone Restore" supplement   Yes Historical Provider, MD  PARoxetine (PAXIL) 10 MG tablet Take 5 mg by mouth every morning.    Yes Historical Provider, MD  Pomegranate, Punica granatum, (POMEGRANATE PO) Take  1 capsule by mouth daily as needed (supplement).   Yes Historical Provider, MD  psyllium (METAMUCIL SMOOTH TEXTURE) 28 % packet Take 1 packet by mouth at bedtime.   Yes Historical Provider, MD  sucralfate (CARAFATE) 1 G tablet Take 1 g by mouth as needed.      Historical Provider, MD  tretinoin (RETIN-A) 0.05 % cream Apply topically at bedtime.      Historical Provider, MD   BP 157/66 mmHg  Pulse 63  Temp(Src) 97.5 F (36.4 C) (Oral)  Resp 18  Ht 5\' 4"  (1.626 m)  Wt 131 lb 13.4 oz (59.8 kg)  BMI 22.62 kg/m2  SpO2 100% Physical Exam  Constitutional: She is oriented to person, place, and  time. She appears well-developed and well-nourished. No distress.  HENT:  Head: Normocephalic.  Eyes: Conjunctivae are normal.  Neck: Neck supple. No tracheal deviation present.  Cardiovascular: Normal rate and regular rhythm.   Pulmonary/Chest: Effort normal. No respiratory distress.  Abdominal: Soft. She exhibits no distension.  Neurological: She is alert and oriented to person, place, and time.  Skin: Skin is warm and dry.  Psychiatric: She has a normal mood and affect.    ED Course  Procedures (including critical care time) Labs Review Labs Reviewed  URINALYSIS, ROUTINE W REFLEX MICROSCOPIC (NOT AT [facility]) - Abnormal; Notable for the following:    Leukocytes, UA TRACE (*)    All other components within normal limits  BASIC METABOLIC PANEL  CBC WITH DIFFERENTIAL/PLATELET  PROTIME-INR  URINE MICROSCOPIC-ADD ON    Imaging Review Ct Head Wo Contrast  03/20/2015   CLINICAL DATA:  Patient fell 2-3 weeks ago. Patient is poor historian. Patient is anticoagulated. Cognitive decline with confusion.  EXAM: CT HEAD WITHOUT CONTRAST  TECHNIQUE: Contiguous axial images were obtained from the base of the skull through the vertex without intravenous contrast.  COMPARISON:  Outside MRI from [place]. This was performed 03/19/2015.  FINDINGS: There is a mixed attenuation subdural collection over the RIGHT frontal lobe. The collection is predominantly hypoattenuating suggesting chronicity. In the more dependent portion, near the pterion, the collection becomes iso to hyperattenuating. As measured on image 22 series 201, thickness is 7 mm. Slight flattening of the RIGHT frontal gyri. Minimal effacement of the RIGHT medial interhemispheric CSF. Midline shift estimated 2 mm as measured at the septum pellucidum.  Generalized atrophy. Chronic microvascular ischemic change of a moderate to advance nature. No acute cortical infarction. Extracranial soft tissues unremarkable. No skull fracture specifically.   IMPRESSION: Mixed attenuation subdural collection over the RIGHT frontal lobe consistent with a predominantly chronic subdural hematoma. 7 mm thickness. Similar appearance to yesterday's MR. Minimal right-to-left shift of 2 mm. Continued surveillance is warranted.   Electronically Signed   By: Rolla Flatten M.D.   On: 03/20/2015 19:47     EKG Interpretation None      MDM   Final diagnoses:  Subdural hemorrhage following injury, with loss of consciousness of unspecified duration, initial encounter    74 year old female presents with multiple falls over the last 2 weeks and consider by her family that she is not able to maintain her balance or ambulate appropriately in her home. She was sent for MRI by her primary care physician yesterday which showed a layering subdural and 2 mm of midline shift. I uploaded the images here and consulted neurosurgery who recommended no acute intervention on this patient without any active signs or symptoms of neurologic dysfunction. Neurosurgery is recommending admission to the hospitalist service for  social support and repeat imaging.    Leo Grosser, MD 03/21/15 0475  Varney Biles, MD 03/21/15 3391  Varney Biles, MD 04/03/15 7921

## 2015-03-20 NOTE — ED Notes (Signed)
Pt ambulated to ED with assistance, no difficulty.   Daughter-- Jiles Garter [phone]--- had to go home-- would like results of MRI--- states pt was dx 2 years ago with some dementia , and she is seeing signs of progression with increasing confusion.

## 2015-03-21 ENCOUNTER — Encounter (HOSPITAL_COMMUNITY): Payer: Self-pay | Admitting: General Practice

## 2015-03-21 DIAGNOSIS — R55 Syncope and collapse: Secondary | ICD-10-CM | POA: Diagnosis not present

## 2015-03-21 DIAGNOSIS — F329 Major depressive disorder, single episode, unspecified: Secondary | ICD-10-CM

## 2015-03-21 DIAGNOSIS — S065X9A Traumatic subdural hemorrhage with loss of consciousness of unspecified duration, initial encounter: Secondary | ICD-10-CM | POA: Diagnosis not present

## 2015-03-21 DIAGNOSIS — Z9841 Cataract extraction status, right eye: Secondary | ICD-10-CM | POA: Diagnosis not present

## 2015-03-21 DIAGNOSIS — F909 Attention-deficit hyperactivity disorder, unspecified type: Secondary | ICD-10-CM | POA: Diagnosis present

## 2015-03-21 DIAGNOSIS — Z9049 Acquired absence of other specified parts of digestive tract: Secondary | ICD-10-CM | POA: Diagnosis present

## 2015-03-21 DIAGNOSIS — Z888 Allergy status to other drugs, medicaments and biological substances status: Secondary | ICD-10-CM | POA: Diagnosis not present

## 2015-03-21 DIAGNOSIS — R2681 Unsteadiness on feet: Secondary | ICD-10-CM | POA: Diagnosis not present

## 2015-03-21 DIAGNOSIS — Z85828 Personal history of other malignant neoplasm of skin: Secondary | ICD-10-CM | POA: Diagnosis not present

## 2015-03-21 DIAGNOSIS — I6200 Nontraumatic subdural hemorrhage, unspecified: Secondary | ICD-10-CM

## 2015-03-21 DIAGNOSIS — W19XXXA Unspecified fall, initial encounter: Secondary | ICD-10-CM | POA: Diagnosis present

## 2015-03-21 DIAGNOSIS — S065X0A Traumatic subdural hemorrhage without loss of consciousness, initial encounter: Secondary | ICD-10-CM | POA: Diagnosis present

## 2015-03-21 DIAGNOSIS — F418 Other specified anxiety disorders: Secondary | ICD-10-CM | POA: Diagnosis present

## 2015-03-21 DIAGNOSIS — F4310 Post-traumatic stress disorder, unspecified: Secondary | ICD-10-CM | POA: Diagnosis present

## 2015-03-21 DIAGNOSIS — Z9842 Cataract extraction status, left eye: Secondary | ICD-10-CM | POA: Diagnosis not present

## 2015-03-21 DIAGNOSIS — K219 Gastro-esophageal reflux disease without esophagitis: Secondary | ICD-10-CM | POA: Diagnosis present

## 2015-03-21 DIAGNOSIS — R42 Dizziness and giddiness: Secondary | ICD-10-CM | POA: Insufficient documentation

## 2015-03-21 DIAGNOSIS — I951 Orthostatic hypotension: Secondary | ICD-10-CM | POA: Diagnosis present

## 2015-03-21 DIAGNOSIS — R296 Repeated falls: Secondary | ICD-10-CM | POA: Diagnosis present

## 2015-03-21 DIAGNOSIS — Z90710 Acquired absence of both cervix and uterus: Secondary | ICD-10-CM | POA: Diagnosis not present

## 2015-03-21 DIAGNOSIS — Z9189 Other specified personal risk factors, not elsewhere classified: Secondary | ICD-10-CM | POA: Diagnosis not present

## 2015-03-21 DIAGNOSIS — Z87891 Personal history of nicotine dependence: Secondary | ICD-10-CM | POA: Diagnosis not present

## 2015-03-21 LAB — CBC
HCT: 39.9 % (ref 36.0–46.0)
HEMOGLOBIN: 13.1 g/dL (ref 12.0–15.0)
MCH: 32.3 pg (ref 26.0–34.0)
MCHC: 32.8 g/dL (ref 30.0–36.0)
MCV: 98.3 fL (ref 78.0–100.0)
PLATELETS: 217 10*3/uL (ref 150–400)
RBC: 4.06 MIL/uL (ref 3.87–5.11)
RDW: 13 % (ref 11.5–15.5)
WBC: 6.3 10*3/uL (ref 4.0–10.5)

## 2015-03-21 LAB — BASIC METABOLIC PANEL
Anion gap: 9 (ref 5–15)
BUN: 12 mg/dL (ref 6–20)
CALCIUM: 9 mg/dL (ref 8.9–10.3)
CO2: 26 mmol/L (ref 22–32)
Chloride: 108 mmol/L (ref 101–111)
Creatinine, Ser: 0.67 mg/dL (ref 0.44–1.00)
GFR calc Af Amer: >60 mL/min (ref >60)
GFR calc non Af Amer: >60 mL/min (ref >60)
GLUCOSE: 93 mg/dL (ref 65–99)
POTASSIUM: 3.7 mmol/L (ref 3.5–5.1)
SODIUM: 143 mmol/L (ref 135–145)

## 2015-03-21 MED ORDER — DOCUSATE SODIUM 100 MG PO CAPS
100.0000 mg | ORAL_CAPSULE | Freq: Two times a day (BID) | ORAL | Status: DC
  Administered 2015-03-21 – 2015-03-23 (×5): 100 mg via ORAL
  Filled 2015-03-21 (×6): qty 1

## 2015-03-21 MED ORDER — ACETAMINOPHEN 325 MG PO TABS
650.0000 mg | ORAL_TABLET | Freq: Four times a day (QID) | ORAL | Status: DC | PRN
Start: 2015-03-21 — End: 2015-03-23
  Administered 2015-03-21 – 2015-03-23 (×4): 650 mg via ORAL
  Filled 2015-03-21 (×4): qty 2

## 2015-03-21 MED ORDER — ONDANSETRON HCL 4 MG/2ML IJ SOLN: 4.0000 mg | Freq: Four times a day (QID) | INTRAMUSCULAR | Status: DC | PRN

## 2015-03-21 MED ORDER — ONDANSETRON HCL 4 MG PO TABS: 4.0000 mg | ORAL_TABLET | Freq: Four times a day (QID) | ORAL | Status: DC | PRN

## 2015-03-21 MED ORDER — ALBUTEROL SULFATE (2.5 MG/3ML) 0.083% IN NEBU: 2.5000 mg | INHALATION_SOLUTION | RESPIRATORY_TRACT | Status: DC | PRN

## 2015-03-21 MED ORDER — PAROXETINE HCL 10 MG PO TABS
5.0000 mg | ORAL_TABLET | Freq: Every day | ORAL | Status: DC
  Administered 2015-03-21 – 2015-03-23 (×3): 5 mg via ORAL
  Filled 2015-03-21 (×4): qty 0.5

## 2015-03-21 MED ORDER — SODIUM CHLORIDE 0.9 % IJ SOLN
3.0000 mL | Freq: Two times a day (BID) | INTRAMUSCULAR | Status: DC
  Administered 2015-03-21 – 2015-03-22 (×4): 3 mL via INTRAVENOUS

## 2015-03-21 MED ORDER — SODIUM CHLORIDE 0.9 % IV SOLN
INTRAVENOUS | Status: DC
  Administered 2015-03-21: 01:00:00 via INTRAVENOUS

## 2015-03-21 MED ORDER — ACETAMINOPHEN 650 MG RE SUPP: 650.0000 mg | Freq: Four times a day (QID) | RECTAL | Status: DC | PRN

## 2015-03-21 MED ORDER — OXYCODONE HCL 5 MG PO TABS
5.0000 mg | ORAL_TABLET | ORAL | Status: DC | PRN
  Filled 2015-03-21: qty 1

## 2015-03-21 NOTE — Progress Notes (Signed)
Patient ID: Autumn Lawson, female   DOB: Nov 15, 1940, 74 y.o.   MRN: [id number] BP 155/68 mmHg  Pulse 68  Temp(Src) 97.6 F (36.4 C) (Oral)  Resp 18  Ht 5\' 4"  (1.626 m)  Wt 59.8 kg (131 lb 13.4 oz)  BMI 22.62 kg/m2  SpO2 98% Alert oriented to person, place, situation, and time Following all commands Perrl, full eom Moving all extremities well Family would like a neurology consult for dementia I have ordered repeat head CT.

## 2015-03-21 NOTE — Evaluation (Signed)
Physical Therapy Evaluation Patient Details Name: Autumn Lawson MRN: [id number] DOB: Apr 27, 1941 Today's Date: 03/21/2015   History of Present Illness  74 y.o. female admitted with SDH, decreased mobility. She had a fall in December 2015 in which she hit her head. 2 weeks ago she had projectile vomiting and thinks she hit her head on the toilet but isn't sure. She has bruising around R eye and has had vertigo with certain head movements. She'd recently started outpt vestibular PT.   Clinical Impression  Pt admitted with above diagnosis. Pt currently with functional limitations due to the deficits listed below (see PT Problem List). Pt ambulated 300' with min hand held assist for balance. Outpatient vestibular rehab recommended (pt had started this prior to admission) and use of cane when ambulating recommended.  Pt will benefit from skilled PT to increase their independence and safety with mobility to allow discharge to the venue listed below.       Follow Up Recommendations Outpatient PT (vestibular rehab)    Equipment Recommendations  Cane    Recommendations for Other Services       Precautions / Restrictions Precautions Precautions: Fall Restrictions Weight Bearing Restrictions: No      Mobility  Bed Mobility Overal bed mobility: Independent                Transfers Overall transfer level: Modified independent                  Ambulation/Gait Ambulation/Gait assistance: Min assist Ambulation Distance (Feet): 300 Feet Assistive device: 1 person hand held assist Gait Pattern/deviations: WFL(Within Functional Limits)   Gait velocity interpretation: at or above normal speed for age/gender General Gait Details: min HHA for balance  Stairs            Wheelchair Mobility    Modified Rankin (Stroke Patients Only)       Balance Overall balance assessment: Needs assistance   Sitting balance-Leahy Scale: Good       Standing balance-Leahy Scale:  Good Standing balance comment: can stand with eyes closed, feet together, tandem stance each for 10 seconds without LOB, Min A for reaching to the floor   Pt reports she's used meclazine at home but it makes her very groggy, she then tried only taking 1/2 pill which worked well for her.                              Pertinent Vitals/Pain Pain Assessment: No/denies pain    Home Living Family/patient expects to be discharged to:: Private residence Living Arrangements: Spouse/significant other Available Help at Discharge: Available 24 hours/day Type of Home: House       Home Layout: Two level;Bed/bath upstairs Home Equipment: Kasandra Knudsen - single point (thinks her husband has a cane, but she can't find it)      Prior Function Level of Independence: Independent         Comments: works out 3x/week, likes yoga and aerobics     Journalist, newspaper        Extremity/Trunk Assessment   Upper Extremity Assessment: Overall WFL for tasks assessed           Lower Extremity Assessment: Overall WFL for tasks assessed      Cervical / Trunk Assessment: Normal  Communication   Communication: No difficulties  Cognition Arousal/Alertness: Awake/alert Behavior During Therapy: WFL for tasks assessed/performed Overall Cognitive Status: Within Functional Limits for tasks assessed  General Comments      Exercises        Assessment/Plan    PT Assessment Patient needs continued PT services  PT Diagnosis Difficulty walking;Other (comment) (vertigo)   PT Problem List Decreased balance  PT Treatment Interventions Gait training;DME instruction;Therapeutic exercise;Balance training   PT Goals (Current goals can be found in the Care Plan section) Acute Rehab PT Goals Patient Stated Goal: to return to working out PT Goal Formulation: With patient/family Time For Goal Achievement: 03/28/15 Potential to Achieve Goals: Good    Frequency Min 3X/week    Barriers to discharge        Co-evaluation               End of Session Equipment Utilized During Treatment: Gait belt Activity Tolerance: Patient tolerated treatment well Patient left: in chair;with call bell/phone within reach;with family/visitor present Nurse Communication: Mobility status    Functional Assessment Tool Used: clinical judgemetn Functional Limitation: Mobility: Walking and moving around Mobility: Walking and Moving Around Current Status [phone]): At least 1 percent but less than 20 percent impaired, limited or restricted Mobility: Walking and Moving Around Goal Status [phone]): 0 percent impaired, limited or restricted    Time: 1100-1127 PT Time Calculation (min) (ACUTE ONLY): 27 min   Charges:   PT Evaluation $Initial PT Evaluation Tier I: 1 Procedure PT Treatments $Gait Training: 8-22 mins   PT G Codes:   PT G-Codes **NOT FOR INPATIENT CLASS** Functional Assessment Tool Used: clinical judgemetn Functional Limitation: Mobility: Walking and moving around Mobility: Walking and Moving Around Current Status (C0034): At least 1 percent but less than 20 percent impaired, limited or restricted Mobility: Walking and Moving Around Goal Status [phone]): 0 percent impaired, limited or restricted    Autumn Lawson 03/21/2015, 11:40 AM [phone]

## 2015-03-21 NOTE — Progress Notes (Signed)
Pt adm to rm 29 via stretcher from ED, a/o x3, LCTA, neuro intact, skin W/D, brusing to rt eye. Pt oriented to unit, rm, telemetry, and poc, expressed understanding.

## 2015-03-21 NOTE — Evaluation (Signed)
Occupational Therapy Evaluation and Discharge Patient Details Name: Autumn Lawson MRN: [id number] DOB: 1941-09-15 Today's Date: 03/21/2015    History of Present Illness 73 y.o. female admitted with SDH, decreased mobility. She had a fall in December 2015 in which she hit her head. 2 weeks ago she had projectile vomiting and thinks she hit her head on the toilet but isn't sure. She has bruising around R eye and has had vertigo with certain head movements. She'd recently started outpt vestibular PT.    Clinical Impression   PTA pt lived at home and was independent with ADLs. Pt currently at Mod I/ Supervision level for tranfers but requires Min A hand held assist for functional mobility due to vertigo and mild balance deficit. Educated pt on use of shower seat and fall prevention for safety during ADLs. No further acute OT needs.     Follow Up Recommendations  No OT follow up;Supervision - Intermittent    Equipment Recommendations  Tub/shower seat    Recommendations for Other Services       Precautions / Restrictions Precautions Precautions: Fall Restrictions Weight Bearing Restrictions: No      Mobility Bed Mobility               General bed mobility comments: Pt sitting in recliner.   Transfers Overall transfer level: Modified independent                         ADL Overall ADL's : Needs assistance/impaired Eating/Feeding: Independent;Sitting   Grooming: Min guard;Standing       Lower Body Bathing: Supervison/ safety;Set up;Sit to/from stand       Lower Body Dressing: Set up;Supervision/safety;Sit to/from stand   Toilet Transfer: Minimal assistance;Ambulation Toilet Transfer Details (indicate cue type and reason): hand held assist due to mild balance deficits and vertigo         Functional mobility during ADLs: Minimal assistance General ADL Comments: Pt at Supervision/Mod I  level for sit<>stand however requires min A hand held assist due  mild balance deficits and vertigo.      Vision Additional Comments: No change from baseline          Pertinent Vitals/Pain Pain Assessment: No/denies pain     Hand Dominance     Extremity/Trunk Assessment Upper Extremity Assessment Upper Extremity Assessment: Overall WFL for tasks assessed   Lower Extremity Assessment Lower Extremity Assessment: Overall WFL for tasks assessed   Cervical / Trunk Assessment Cervical / Trunk Assessment: Normal   Communication Communication Communication: No difficulties   Cognition Arousal/Alertness: Awake/alert Behavior During Therapy: WFL for tasks assessed/performed Overall Cognitive Status: Within Functional Limits for tasks assessed                                Home Living Family/patient expects to be discharged to:: Private residence Living Arrangements: Spouse/significant other Available Help at Discharge: Available 24 hours/day Type of Home: House       Home Layout: Two level;Bed/bath upstairs               Home Equipment: Cane - single point          Prior Functioning/Environment Level of Independence: Independent        Comments: works out 3x/week, likes yoga and aerobics    OT Diagnosis: Generalized weakness;Other (comment) (vertigo)    End of Session Equipment Utilized During Treatment: Gait belt  Activity Tolerance:  Patient tolerated treatment well Patient left: in chair;with call bell/phone within reach;with family/visitor present   Time: 5993-5701 OT Time Calculation (min): 36 min Charges:  OT General Charges $OT Visit: 1 Procedure OT Evaluation $Initial OT Evaluation Tier I: 1 Procedure OT Treatments $Self Care/Home Management : 8-22 mins G-Codes: OT G-codes **NOT FOR INPATIENT CLASS** Functional Assessment Tool Used: clinical judgement Functional Limitation: Self care Self Care Current Status (X7939): 0 percent impaired, limited or restricted Self Care Goal Status (Q3009): 0  percent impaired, limited or restricted Self Care Discharge Status (Q3300): 0 percent impaired, limited or restricted  Villa Herb M 03/21/2015, 4:09 PM   Cyndie Chime, OTR/L Occupational Therapist [phone] (pager)

## 2015-03-21 NOTE — Progress Notes (Signed)
PROGRESS NOTE  Autumn Lawson PYP:[id number] DOB: [birth year] DOA: 03/20/2015 PCP: Andres Shad, MD  Brief history 74 year old female with a history of depression and vertigo presented to the emergency department after an outpatient MRI [place], [place]) revealed that she had a subdural hematoma. The patient's husband supplements this history at the bedside. In December 2015, the patient had a mechanical fall secondary to  a wet floor resulting in facial hematoma. According to the family, the patient has had a history of vertigo for number of years and was undergoing vestibular therapy with physical therapy and outpatient setting. Approximately 2 weeks prior to this admission, the patient woke up in the middle of the night with nausea and vomiting. She did not recall all the events, but the following morning, she woke up with bruises on her face and right periorbital area. Since then, the patient has had worsening vertigo and unsteady gait. As a result they went to the family doctor who ordered an outpatient MRI.  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 Gait Instability/Vertigo -Husband wants neurology evaluation which I have requested -?question syncope from 2 weeks ago -orthostatic vitals -echo -no concerning dysrhythmia on telemetry  -PT-->outpt PT -Urinalysis negative for pyuria -Check TSH, B12 Depression -continue paxil     Family Communication:   Husband and daughter update at beside >35 min spent, >50% spent counseling and coordinating care Disposition Plan:   Home when medically stable      Procedures/Studies: Ct Head Wo Contrast  03/20/2015   CLINICAL DATA:  Patient fell 2-3 weeks ago. Patient is poor historian. Patient is anticoagulated. Cognitive decline with confusion.  EXAM: CT HEAD WITHOUT CONTRAST   TECHNIQUE: Contiguous axial images were obtained from the base of the skull through the vertex without intravenous contrast.  COMPARISON:  Outside MRI from [place]. This was performed 03/19/2015.  FINDINGS: There is a mixed attenuation subdural collection over the RIGHT frontal lobe. The collection is predominantly hypoattenuating suggesting chronicity. In the more dependent portion, near the pterion, the collection becomes iso to hyperattenuating. As measured on image 22 series 201, thickness is 7 mm. Slight flattening of the RIGHT frontal gyri. Minimal effacement of the RIGHT medial interhemispheric CSF. Midline shift estimated 2 mm as measured at the septum pellucidum.  Generalized atrophy. Chronic microvascular ischemic change of a moderate to advance nature. No acute cortical infarction. Extracranial soft tissues unremarkable. No skull fracture specifically.  IMPRESSION: Mixed attenuation subdural collection over the RIGHT frontal lobe consistent with a predominantly chronic subdural hematoma. 7 mm thickness. Similar appearance to yesterday's MR. Minimal right-to-left shift of 2 mm. Continued surveillance is warranted.   Electronically Signed   By: Rolla Flatten M.D.   On: 03/20/2015 19:47         Subjective: Patient is feeling better. She still has intermittent headache in his occipital area. Denies any fever, chills, chest pain, shortness breath, nausea, vomiting, diarrhea, abdominal pain, dysuria, hematuria. No rashes. No hematochezia or melena.   Objective: Filed Vitals:   03/20/15 2142 03/20/15 2224 03/20/15 2334 03/21/15 0518  BP: 144/79 147/73 157/66 128/62  Pulse: 70 65 63 66  Temp:   97.5 F (36.4 C) 97.9 F (36.6 C)  TempSrc:   Oral Oral  Resp: 16 18 18 16   Height:   5\' 4"  (1.626 m)   Weight:  59.8 kg (131 lb 13.4 oz)   SpO2: 94% 95% 100% 97%    Intake/Output Summary (Last 24 hours) at 03/21/15 1115 Last data filed at 03/21/15 0900  Gross per 24 hour  Intake    460 ml    Output   1300 ml  Net   -840 ml   Weight change:  Exam:   General:  Pt is alert, follows commands appropriately, not in acute distress  HEENT: No icterus, No thrush, No neck mass, [place]/AT; no meningismus    Cardiovascular: RRR, S1/S2, no rubs, no gallops  Respiratory: CTA bilaterally, no wheezing, no crackles, no rhonchi  Abdomen: Soft/+BS, non tender, non distended, no guarding; no hepatosplenomegaly   Extremities: No edema, No lymphangitis, No petechiae, No rashes, no synovitis  Data Reviewed: Basic Metabolic Panel:  Recent Labs Lab 03/20/15 1843 03/21/15 0530  NA 140 143  K 3.6 3.7  CL 107 108  CO2 25 26  GLUCOSE 98 93  BUN 15 12  CREATININE 0.69 0.67  CALCIUM 9.2 9.0   Liver Function Tests: No results for input(s): AST, ALT, ALKPHOS, BILITOT, PROT, ALBUMIN in the last 168 hours. No results for input(s): LIPASE, AMYLASE in the last 168 hours. No results for input(s): AMMONIA in the last 168 hours. CBC:  Recent Labs Lab 03/20/15 1843 03/21/15 0530  WBC 7.5 6.3  NEUTROABS 4.5  --   HGB 14.2 13.1  HCT 42.6 39.9  MCV 97.7 98.3  PLT 224 217   Cardiac Enzymes: No results for input(s): CKTOTAL, CKMB, CKMBINDEX, TROPONINI in the last 168 hours. BNP: Invalid input(s): POCBNP CBG: No results for input(s): GLUCAP in the last 168 hours.  No results found for this or any previous visit (from the past 240 hour(s)).   Scheduled Meds: . docusate sodium  100 mg Oral BID  . PARoxetine  5 mg Oral Daily  . sodium chloride  3 mL Intravenous Q12H   Continuous Infusions: . sodium chloride 75 mL/hr at 03/21/15 0030     Destenee Guerry, DO  [facility] Pager [phone]  If 7PM-7AM, please contact night-coverage [email or web address] Password [email or web address] 03/21/2015, 11:15 AM

## 2015-03-21 NOTE — Consult Note (Signed)
NEURO HOSPITALIST CONSULT NOTE   Referring physician: D Tat Reason for Consult: dizziness  HPI:                                                                                                                                          Autumn Lawson is an 74 y.o. female with a past medical history significant for ADD, PTSD, depression, and chronic vertigo, admitted to [facility] on 6/8 due to worsening vertigo and unsteady gait after sustaining a fall 2 weeks ago. Outpatient MRI brain reportedly disclosed a right frontal SDH. Further, follow up CT brain performed upon admission was personally reviewed and showed a mixed attenuation 7 mm subdural collection over the RIGHT frontal lobe consistent with a predominantly chronic subdural hematoma with minimal right-to-left shift of 2 mm.  Patient already evaluated by neurosurgery who recommended no surgical intervention. A neurology consultation for dizziness was asked by husband request. Autumn Lawson indicated that she sustained " a bout of vertigo precipitated by head movements that was really short lastin in 2015", and has been getting vestibular rehab with substantial improvement.  Unfortunately, 3 weeks ago she was sick with a bad upper respiratory infection and one night woke up with severe nausea, went to the bathroom and had projectile vomiting, vertigo, and she thinks that she passed out and hit her head and face in the sink. Ever since, she has been experiencing worsening vertigo " like room spinning" every time she moves her head in any direction. She also complains of soreness in the top and back of her head but denies double vision, difficulty swallowing, focal weakness, unsteadiness, slurred speech, confusion, language or vision impairment. Available serologies are unrevealing. TSH pending.  Past Medical History  Diagnosis Date  . ADD (attention deficit disorder with hyperactivity)   . Depression with anxiety   . Vertigo   .  PTSD (post-traumatic stress disorder)   . Esophageal reflux   . Gallstone   . Skin cancer, basal cell   . Elevated LFTs   . Vertigo 03/2015  . Hematoma     Past Surgical History  Procedure Laterality Date  . Varicose vein surgery    . Cesarean section      x4  . Cataract extraction      bilateral  . Abdominal hysterectomy    . Back surgery      rod in back placed  . Liver surgery  1984  . Cholecystectomy  1981    Family History  Problem Relation Age of Onset  . Leukemia Father   . Prostate cancer Other     paternal [place] grandfather  . Alzheimer's disease Mother     Family History: no epilepsy, brain tumors, or brain aneurysms.   Social History:  reports that she quit  smoking about 38 years ago. She has never used smokeless tobacco. She reports that she does not drink alcohol or use illicit drugs.  Allergies  Allergen Reactions  . Levaquin [Levofloxacin In D5w] Other (See Comments)    Joint swelling    MEDICATIONS:                                                                                                                     Scheduled: . docusate sodium  100 mg Oral BID  . PARoxetine  5 mg Oral Daily  . sodium chloride  3 mL Intravenous Q12H     ROS:                                                                                                                                       History obtained from chart review and the patient  General ROS: negative for - chills, fatigue, fever, night sweats, weight gain or weight loss Psychological ROS: negative for - behavioral disorder, hallucinations, memory difficulties, or suicidal ideation Ophthalmic ROS: negative for - blurry vision, double vision, eye pain or loss of vision ENT ROS: negative for - epistaxis, nasal discharge, oral lesions, sore throat, tinnitus or vertigo Allergy and Immunology ROS: negative for - hives or itchy/watery eyes Hematological and Lymphatic ROS: negative for - bleeding problems,  bruising or swollen lymph nodes Endocrine ROS: negative for - galactorrhea, hair pattern changes, polydipsia/polyuria or temperature intolerance Respiratory ROS: negative for - cough, hemoptysis, shortness of breath or wheezing Cardiovascular ROS: negative for - chest pain, dyspnea on exertion, edema or irregular heartbeat Gastrointestinal ROS: negative for - abdominal pain, diarrhea, hematemesis, nausea/vomiting or stool incontinence Genito-Urinary ROS: negative for - dysuria, hematuria, incontinence or urinary frequency/urgency Musculoskeletal ROS: negative for - joint swelling or muscular weakness Neurological ROS: as noted in HPI Dermatological ROS: negative for rash and skin lesion changes   Physical exam: pleasant female in no apparent distress. Blood pressure 155/68, pulse 68, temperature 97.6 F (36.4 C), temperature source Oral, resp. rate 18, height '5\' 4"'  (1.626 m), weight 59.8 kg (131 lb 13.4 oz), SpO2 98 %. Head: normocephalic. Neck: supple, no bruits, no JVD. Cardiac: no murmurs. Lungs: clear. Abdomen: soft, no tender, no mass. Extremities: no edema. Ski: no rash  Neurologic Examination:  General: Mental Status: Alert, oriented, thought content appropriate.  Speech fluent without evidence of aphasia.  Able to follow 3 step commands without difficulty. Cranial Nerves: II: Discs flat bilaterally; Visual fields grossly normal, pupils equal, round, reactive to light and accommodation III,IV, VI: ptosis not present, extra-ocular motions intact bilaterally V,VII: smile symmetric, facial light touch sensation normal bilaterally VIII: hearing normal bilaterally IX,X: uvula rises symmetrically XI: bilateral shoulder shrug XII: midline tongue extension without atrophy or fasciculations  Motor: Right : Upper extremity   5/5    Left:     Upper extremity   5/5  Lower extremity    5/5     Lower extremity   5/5 Tone and bulk:normal tone throughout; no atrophy noted Sensory: Pinprick and light touch intact throughout, bilaterally Deep Tendon Reflexes:  Right: Upper Extremity   Left: Upper extremity   biceps (C-5 to C-6) 2/4   biceps (C-5 to C-6) 2/4 tricep (C7) 2/4    triceps (C7) 2/4 Brachioradialis (C6) 2/4  Brachioradialis (C6) 2/4  Lower Extremity Lower Extremity  quadriceps (L-2 to L-4) 2/4   quadriceps (L-2 to L-4) 2/4 Achilles (S1) 2/4   Achilles (S1) 2/4  Plantars: Right: downgoing   Left: downgoing Cerebellar: normal finger-to-nose,  normal heel-to-shin test Gait:  No ataxia.    Lab Results  Component Value Date/Time   CHOL  01/03/2010 04:43 AM    168        ATP III CLASSIFICATION:  <200     mg/dL   Desirable  200-239  mg/dL   Borderline High  >=240    mg/dL   High           Results for orders placed or performed during the hospital encounter of 03/20/15 (from the past 48 hour(s))  Urinalysis, Routine w reflex microscopic (not at [facility])     Status: Abnormal   Collection Time: 03/20/15  6:24 PM  Result Value Ref Range   Color, Urine YELLOW YELLOW   APPearance CLEAR CLEAR   Specific Gravity, Urine 1.014 1.005 - 1.030   pH 7.0 5.0 - 8.0   Glucose, UA NEGATIVE NEGATIVE mg/dL   Hgb urine dipstick NEGATIVE NEGATIVE   Bilirubin Urine NEGATIVE NEGATIVE   Ketones, ur NEGATIVE NEGATIVE mg/dL   Protein, ur NEGATIVE NEGATIVE mg/dL   Urobilinogen, UA 0.2 0.0 - 1.0 mg/dL   Nitrite NEGATIVE NEGATIVE   Leukocytes, UA TRACE (A) NEGATIVE  Urine microscopic-add on     Status: None   Collection Time: 03/20/15  6:24 PM  Result Value Ref Range   Squamous Epithelial / LPF RARE RARE   WBC, UA 0-2 <3 WBC/hpf  Basic metabolic panel     Status: None   Collection Time: 03/20/15  6:43 PM  Result Value Ref Range   Sodium 140 135 - 145 mmol/L   Potassium 3.6 3.5 - 5.1 mmol/L   Chloride 107 101 - 111 mmol/L   CO2 25 22 - 32 mmol/L   Glucose, Bld 98 65 - 99  mg/dL   BUN 15 6 - 20 mg/dL   Creatinine, Ser 0.69 0.44 - 1.00 mg/dL   Calcium 9.2 8.9 - 10.3 mg/dL   GFR calc non Af Amer >60 >60 mL/min   GFR calc Af Amer >60 >60 mL/min    Comment: (NOTE) The eGFR has been calculated using the CKD EPI equation. This calculation has not been validated in all clinical situations. eGFR's persistently <60 mL/min signify possible Chronic Kidney Disease.    Anion  gap 8 5 - 15  CBC with Differential     Status: None   Collection Time: 03/20/15  6:43 PM  Result Value Ref Range   WBC 7.5 4.0 - 10.5 K/uL   RBC 4.36 3.87 - 5.11 MIL/uL   Hemoglobin 14.2 12.0 - 15.0 g/dL   HCT 42.6 36.0 - 46.0 %   MCV 97.7 78.0 - 100.0 fL   MCH 32.6 26.0 - 34.0 pg   MCHC 33.3 30.0 - 36.0 g/dL   RDW 12.8 11.5 - 15.5 %   Platelets 224 150 - 400 K/uL   Neutrophils Relative % 60 43 - 77 %   Neutro Abs 4.5 1.7 - 7.7 K/uL   Lymphocytes Relative 30 12 - 46 %   Lymphs Abs 2.2 0.7 - 4.0 K/uL   Monocytes Relative 8 3 - 12 %   Monocytes Absolute 0.6 0.1 - 1.0 K/uL   Eosinophils Relative 2 0 - 5 %   Eosinophils Absolute 0.2 0.0 - 0.7 K/uL   Basophils Relative 0 0 - 1 %   Basophils Absolute 0.0 0.0 - 0.1 K/uL  Protime-INR     Status: None   Collection Time: 03/20/15  6:43 PM  Result Value Ref Range   Prothrombin Time 13.6 11.6 - 15.2 seconds   INR 1.02 0.00 - 6.29  Basic metabolic panel     Status: None   Collection Time: 03/21/15  5:30 AM  Result Value Ref Range   Sodium 143 135 - 145 mmol/L   Potassium 3.7 3.5 - 5.1 mmol/L   Chloride 108 101 - 111 mmol/L   CO2 26 22 - 32 mmol/L   Glucose, Bld 93 65 - 99 mg/dL   BUN 12 6 - 20 mg/dL   Creatinine, Ser 0.67 0.44 - 1.00 mg/dL   Calcium 9.0 8.9 - 10.3 mg/dL   GFR calc non Af Amer >60 >60 mL/min   GFR calc Af Amer >60 >60 mL/min    Comment: (NOTE) The eGFR has been calculated using the CKD EPI equation. This calculation has not been validated in all clinical situations. eGFR's persistently <60 mL/min signify possible  Chronic Kidney Disease.    Anion gap 9 5 - 15  CBC     Status: None   Collection Time: 03/21/15  5:30 AM  Result Value Ref Range   WBC 6.3 4.0 - 10.5 K/uL   RBC 4.06 3.87 - 5.11 MIL/uL   Hemoglobin 13.1 12.0 - 15.0 g/dL   HCT 39.9 36.0 - 46.0 %   MCV 98.3 78.0 - 100.0 fL   MCH 32.3 26.0 - 34.0 pg   MCHC 32.8 30.0 - 36.0 g/dL   RDW 13.0 11.5 - 15.5 %   Platelets 217 150 - 400 K/uL    Ct Head Wo Contrast  03/20/2015   CLINICAL DATA:  Patient fell 2-3 weeks ago. Patient is poor historian. Patient is anticoagulated. Cognitive decline with confusion.  EXAM: CT HEAD WITHOUT CONTRAST  TECHNIQUE: Contiguous axial images were obtained from the base of the skull through the vertex without intravenous contrast.  COMPARISON:  Outside MRI from [place]. This was performed 03/19/2015.  FINDINGS: There is a mixed attenuation subdural collection over the RIGHT frontal lobe. The collection is predominantly hypoattenuating suggesting chronicity. In the more dependent portion, near the pterion, the collection becomes iso to hyperattenuating. As measured on image 22 series 201, thickness is 7 mm. Slight flattening of the RIGHT frontal gyri. Minimal effacement of the RIGHT medial interhemispheric CSF.  Midline shift estimated 2 mm as measured at the septum pellucidum.  Generalized atrophy. Chronic microvascular ischemic change of a moderate to advance nature. No acute cortical infarction. Extracranial soft tissues unremarkable. No skull fracture specifically.  IMPRESSION: Mixed attenuation subdural collection over the RIGHT frontal lobe consistent with a predominantly chronic subdural hematoma. 7 mm thickness. Similar appearance to yesterday's MR. Minimal right-to-left shift of 2 mm. Continued surveillance is warranted.   Electronically Signed   By: Rolla Flatten M.D.   On: 03/20/2015 19:47    Assessment/Plan: Pleasant 74 y/o female with prior attack of isolated peripheral vertigo in 2015, status post recent URI that  resulted on recurrent vestibular vertigo, fall and post traumatic right frontal SDH. Neuro-exam is non focal, outside brain MRI showed no acute cerebellar or brainstem infarct but a  mixed attenuation 7 mm subdural collection over the RIGHT frontal lobe consistent with a predominantly chronic subdural hematoma with minimal right-to-left shift of 2 mm.  Follow up CT brain 6/8 with unchanged findings. 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 Her new brain insult will certainly contribute to a longer duration of her symptoms, but at this time no further neurological testing is required. Follow up CT brain to ensure stability of right frontal SDH as per neurosurgery. PT/vestibular rehab.  Dorian Pod, MD 03/21/2015, 8:36 PM  [facility]

## 2015-03-22 ENCOUNTER — Inpatient Hospital Stay (HOSPITAL_COMMUNITY): Payer: Medicare Other

## 2015-03-22 DIAGNOSIS — S065X9A Traumatic subdural hemorrhage with loss of consciousness of unspecified duration, initial encounter: Secondary | ICD-10-CM | POA: Insufficient documentation

## 2015-03-22 DIAGNOSIS — R55 Syncope and collapse: Secondary | ICD-10-CM

## 2015-03-22 DIAGNOSIS — Z9189 Other specified personal risk factors, not elsewhere classified: Secondary | ICD-10-CM

## 2015-03-22 DIAGNOSIS — S065XAA Traumatic subdural hemorrhage with loss of consciousness status unknown, initial encounter: Secondary | ICD-10-CM | POA: Insufficient documentation

## 2015-03-22 LAB — CBC
HCT: 40.3 % (ref 36.0–46.0)
Hemoglobin: 13.6 g/dL (ref 12.0–15.0)
MCH: 33.2 pg (ref 26.0–34.0)
MCHC: 33.7 g/dL (ref 30.0–36.0)
MCV: 98.3 fL (ref 78.0–100.0)
Platelets: 218 10*3/uL (ref 150–400)
RBC: 4.1 MIL/uL (ref 3.87–5.11)
RDW: 13.1 % (ref 11.5–15.5)
WBC: 6.7 10*3/uL (ref 4.0–10.5)

## 2015-03-22 LAB — TSH: TSH: 1.406 u[IU]/mL (ref 0.350–4.500)

## 2015-03-22 LAB — VITAMIN B12: Vitamin B-12: 635 pg/mL (ref 180–914)

## 2015-03-22 MED ORDER — SODIUM CHLORIDE 0.9 % IV SOLN
INTRAVENOUS | Status: DC
  Administered 2015-03-22 – 2015-03-23 (×2): via INTRAVENOUS
  Filled 2015-03-22 (×3): qty 1000

## 2015-03-22 NOTE — Progress Notes (Signed)
  Echocardiogram 2D Echocardiogram has been performed.  Johny Chess 03/22/2015, 11:46 AM

## 2015-03-22 NOTE — Consult Note (Signed)
Cardiologist:  New Reason for Consult: Syncope Referring Physician:   Raenah Murley is an 74 y.o. female.  HPI:   The patient is a 74 yo vegan female with a history of ADD, Chronic Vertigo, depression, anxiety, esophageal reflux, PTSD, elevated LFT's.  No family history of CAD, syncope.  She exercises fairly regularly.  She reports that about three weeks ago she had a virus and was in the bathroom vomiting.  She thinks she passed out but is unsure.  It was not until the morning when she looked in the mirror she saw the bruising around her right eye.  She has had worsening problems with vertigo and gait.  She has not had any other syncopal episodes.  Sometimes has lower extremity edema she attributes to varicose veins. No palpitations, nausea, vomiting, fever, chest pain, shortness of breath, orthopnea, PND, cough, congestion, abdominal pain, hematochezia, melena, claudication.   MRI in [place] showed subdural hematoma in the right frontal area.  2D echo shows normal EF LV and RV size.   CT Head W/O contrast IMPRESSION: 1. Stable size in appearance of right anterior subdural hematoma. 2. 3 mm of midline shift is stable. 3. Stable atrophy and white matter disease.  Study Conclusions  - Left ventricle: The cavity size was normal. Wall thickness was normal. The estimated ejection fraction was 60%. Wall motion was normal; there were no regional wall motion abnormalities. - Right ventricle: The cavity size was normal. Systolic function was normal  TSH WNL  Past Medical History  Diagnosis Date  . ADD (attention deficit disorder with hyperactivity)   . Depression with anxiety   . Vertigo   . PTSD (post-traumatic stress disorder)   . Esophageal reflux   . Gallstone   . Skin cancer, basal cell   . Elevated LFTs   . Vertigo 03/2015  . Hematoma     Past Surgical History  Procedure Laterality Date  . Varicose vein surgery    . Cesarean section      x4  . Cataract  extraction      bilateral  . Abdominal hysterectomy    . Back surgery      rod in back placed  . Liver surgery  1984  . Cholecystectomy  1981    Family History  Problem Relation Age of Onset  . Leukemia Father   . Prostate cancer Other     paternal [place] grandfather  . Alzheimer's disease Mother     Social History:  reports that she quit smoking about 38 years ago. She has never used smokeless tobacco. She reports that she does not drink alcohol or use illicit drugs.  Allergies:  Allergies  Allergen Reactions  . Levaquin [Levofloxacin In D5w] Other (See Comments)    Joint swelling    Medications: Scheduled Meds: . docusate sodium  100 mg Oral BID  . PARoxetine  5 mg Oral Daily  . sodium chloride  3 mL Intravenous Q12H   Continuous Infusions: . sodium chloride 0.9 % 1,000 mL with potassium chloride 20 mEq infusion     PRN Meds:.acetaminophen **OR** acetaminophen, albuterol, ondansetron **OR** ondansetron (ZOFRAN) IV, oxyCODONE   Results for orders placed or performed during the hospital encounter of 03/20/15 (from the past 48 hour(s))  Urinalysis, Routine w reflex microscopic (not at [facility])     Status: Abnormal   Collection Time: 03/20/15  6:24 PM  Result Value Ref Range   Color, Urine YELLOW YELLOW   APPearance CLEAR CLEAR   Specific  Gravity, Urine 1.014 1.005 - 1.030   pH 7.0 5.0 - 8.0   Glucose, UA NEGATIVE NEGATIVE mg/dL   Hgb urine dipstick NEGATIVE NEGATIVE   Bilirubin Urine NEGATIVE NEGATIVE   Ketones, ur NEGATIVE NEGATIVE mg/dL   Protein, ur NEGATIVE NEGATIVE mg/dL   Urobilinogen, UA 0.2 0.0 - 1.0 mg/dL   Nitrite NEGATIVE NEGATIVE   Leukocytes, UA TRACE (A) NEGATIVE  Urine microscopic-add on     Status: None   Collection Time: 03/20/15  6:24 PM  Result Value Ref Range   Squamous Epithelial / LPF RARE RARE   WBC, UA 0-2 <3 WBC/hpf  Basic metabolic panel     Status: None   Collection Time: 03/20/15  6:43 PM  Result Value Ref Range   Sodium 140 135 -  145 mmol/L   Potassium 3.6 3.5 - 5.1 mmol/L   Chloride 107 101 - 111 mmol/L   CO2 25 22 - 32 mmol/L   Glucose, Bld 98 65 - 99 mg/dL   BUN 15 6 - 20 mg/dL   Creatinine, Ser 0.69 0.44 - 1.00 mg/dL   Calcium 9.2 8.9 - 10.3 mg/dL   GFR calc non Af Amer >60 >60 mL/min   GFR calc Af Amer >60 >60 mL/min    Comment: (NOTE) The eGFR has been calculated using the CKD EPI equation. This calculation has not been validated in all clinical situations. eGFR's persistently <60 mL/min signify possible Chronic Kidney Disease.    Anion gap 8 5 - 15  CBC with Differential     Status: None   Collection Time: 03/20/15  6:43 PM  Result Value Ref Range   WBC 7.5 4.0 - 10.5 K/uL   RBC 4.36 3.87 - 5.11 MIL/uL   Hemoglobin 14.2 12.0 - 15.0 g/dL   HCT 42.6 36.0 - 46.0 %   MCV 97.7 78.0 - 100.0 fL   MCH 32.6 26.0 - 34.0 pg   MCHC 33.3 30.0 - 36.0 g/dL   RDW 12.8 11.5 - 15.5 %   Platelets 224 150 - 400 K/uL   Neutrophils Relative % 60 43 - 77 %   Neutro Abs 4.5 1.7 - 7.7 K/uL   Lymphocytes Relative 30 12 - 46 %   Lymphs Abs 2.2 0.7 - 4.0 K/uL   Monocytes Relative 8 3 - 12 %   Monocytes Absolute 0.6 0.1 - 1.0 K/uL   Eosinophils Relative 2 0 - 5 %   Eosinophils Absolute 0.2 0.0 - 0.7 K/uL   Basophils Relative 0 0 - 1 %   Basophils Absolute 0.0 0.0 - 0.1 K/uL  Protime-INR     Status: None   Collection Time: 03/20/15  6:43 PM  Result Value Ref Range   Prothrombin Time 13.6 11.6 - 15.2 seconds   INR 1.02 0.00 - 4.01  Basic metabolic panel     Status: None   Collection Time: 03/21/15  5:30 AM  Result Value Ref Range   Sodium 143 135 - 145 mmol/L   Potassium 3.7 3.5 - 5.1 mmol/L   Chloride 108 101 - 111 mmol/L   CO2 26 22 - 32 mmol/L   Glucose, Bld 93 65 - 99 mg/dL   BUN 12 6 - 20 mg/dL   Creatinine, Ser 0.67 0.44 - 1.00 mg/dL   Calcium 9.0 8.9 - 10.3 mg/dL   GFR calc non Af Amer >60 >60 mL/min   GFR calc Af Amer >60 >60 mL/min    Comment: (NOTE) The eGFR has been calculated using  the CKD EPI  equation. This calculation has not been validated in all clinical situations. eGFR's persistently <60 mL/min signify possible Chronic Kidney Disease.    Anion gap 9 5 - 15  CBC     Status: None   Collection Time: 03/21/15  5:30 AM  Result Value Ref Range   WBC 6.3 4.0 - 10.5 K/uL   RBC 4.06 3.87 - 5.11 MIL/uL   Hemoglobin 13.1 12.0 - 15.0 g/dL   HCT 39.9 36.0 - 46.0 %   MCV 98.3 78.0 - 100.0 fL   MCH 32.3 26.0 - 34.0 pg   MCHC 32.8 30.0 - 36.0 g/dL   RDW 13.0 11.5 - 15.5 %   Platelets 217 150 - 400 K/uL  TSH     Status: None   Collection Time: 03/22/15  3:49 AM  Result Value Ref Range   TSH 1.406 0.350 - 4.500 uIU/mL  Vitamin B12     Status: None   Collection Time: 03/22/15  3:49 AM  Result Value Ref Range   Vitamin B-12 635 180 - 914 pg/mL    Comment: (NOTE) This assay is not validated for testing neonatal or myeloproliferative syndrome specimens for Vitamin B12 levels.   CBC     Status: None   Collection Time: 03/22/15  3:49 AM  Result Value Ref Range   WBC 6.7 4.0 - 10.5 K/uL   RBC 4.10 3.87 - 5.11 MIL/uL   Hemoglobin 13.6 12.0 - 15.0 g/dL   HCT 40.3 36.0 - 46.0 %   MCV 98.3 78.0 - 100.0 fL   MCH 33.2 26.0 - 34.0 pg   MCHC 33.7 30.0 - 36.0 g/dL   RDW 13.1 11.5 - 15.5 %   Platelets 218 150 - 400 K/uL    Ct Head Wo Contrast  03/22/2015   CLINICAL DATA:  Subdural hematoma.  EXAM: CT HEAD WITHOUT CONTRAST  TECHNIQUE: Contiguous axial images were obtained from the base of the skull through the vertex without intravenous contrast.  COMPARISON:  None.  FINDINGS: The anterior right frontal convexity subdural hematoma is not significantly changed. 3 mm of midline shift is again noted. No new hemorrhage is present. Lower density blood is seen over the more superior aspect of the convexity.  Atrophy and white matter disease is stable. No acute cortical infarct is present. The basal ganglia are intact. The ventricles are proportionate to the degree of atrophy.  The paranasal  sinuses and mastoid air cells are clear. The calvarium is intact. Bilateral lens extractions are noted. The globes and orbits are otherwise within normal limits.  IMPRESSION: 1. Stable size in appearance of right anterior subdural hematoma. 2. 3 mm of midline shift is stable. 3. Stable atrophy and white matter disease.   Electronically Signed   By: San Morelle M.D.   On: 03/22/2015 07:39   Ct Head Wo Contrast  03/20/2015   CLINICAL DATA:  Patient fell 2-3 weeks ago. Patient is poor historian. Patient is anticoagulated. Cognitive decline with confusion.  EXAM: CT HEAD WITHOUT CONTRAST  TECHNIQUE: Contiguous axial images were obtained from the base of the skull through the vertex without intravenous contrast.  COMPARISON:  Outside MRI from [place]. This was performed 03/19/2015.  FINDINGS: There is a mixed attenuation subdural collection over the RIGHT frontal lobe. The collection is predominantly hypoattenuating suggesting chronicity. In the more dependent portion, near the pterion, the collection becomes iso to hyperattenuating. As measured on image 22 series 201, thickness is 7 mm. Slight flattening of the  RIGHT frontal gyri. Minimal effacement of the RIGHT medial interhemispheric CSF. Midline shift estimated 2 mm as measured at the septum pellucidum.  Generalized atrophy. Chronic microvascular ischemic change of a moderate to advance nature. No acute cortical infarction. Extracranial soft tissues unremarkable. No skull fracture specifically.  IMPRESSION: Mixed attenuation subdural collection over the RIGHT frontal lobe consistent with a predominantly chronic subdural hematoma. 7 mm thickness. Similar appearance to yesterday's MR. Minimal right-to-left shift of 2 mm. Continued surveillance is warranted.   Electronically Signed   By: Rolla Flatten M.D.   On: 03/20/2015 19:47    Review of Systems  Constitutional: Negative for fever and diaphoresis.  HENT: Negative for congestion and sore throat.     Respiratory: Negative for cough and shortness of breath.   Cardiovascular: Positive for leg swelling (Mild LEE attributed to varicose veins.). Negative for chest pain, palpitations, orthopnea, claudication and PND.  Gastrointestinal: Negative for nausea, vomiting, abdominal pain, blood in stool and melena.  Genitourinary: Negative for hematuria.  Musculoskeletal: Positive for myalgias (Back of her head. ).  Neurological: Positive for dizziness.  All other systems reviewed and are negative.  Blood pressure 150/72, pulse 80, temperature 98 F (36.7 C), temperature source Oral, resp. rate 18, height '5\' 4"'  (1.626 m), weight 131 lb 13.4 oz (59.8 kg), SpO2 99 %. Physical Exam  Nursing note and vitals reviewed. Constitutional: She is oriented to person, place, and time. She appears well-developed and well-nourished. No distress.  HENT:  Head: Normocephalic.  Late stage ecchymosis inferior periorbital right eye   Eyes: EOM are normal. Pupils are equal, round, and reactive to light. No scleral icterus.  Neck: Normal range of motion. Neck supple.  Cardiovascular: Normal rate, regular rhythm, S1 normal and S2 normal.   No murmur heard. Pulses:      Radial pulses are 2+ on the right side, and 2+ on the left side.       Dorsalis pedis pulses are 2+ on the right side, and 2+ on the left side.  No Carotid bruit  Respiratory: Effort normal and breath sounds normal. She has no wheezes. She has no rales.  GI: Soft. Bowel sounds are normal. She exhibits no distension. There is no tenderness.  Musculoskeletal: She exhibits no edema or tenderness.  Lymphadenopathy:    She has no cervical adenopathy.  Neurological: She is alert and oriented to person, place, and time. She exhibits normal muscle tone.  Skin: Skin is warm and dry.  Psychiatric: She has a normal mood and affect.    Assessment/Plan: Principal Problem:   SDH (subdural hematoma) Active Problems:   Depression   Unsteady gait    Vertigo   Dizziness   Subdural hematoma   Syncope  Her syncope sounds vagally mediated.  Telemetry monitoring the last 24 hours reveals NSR.  No significant alarms. Her echocardiogram is normal.  TSH WNL.  We will monitor on tele until discharge which will likely be tomorrow.  I do not think any further heart monitoring is required beyond that.  She is getting vestibular PT.    Tarri Fuller, [facility]  03/22/2015, 4:15 PM   I personally saw and examined the patient along with Mr. Samara Snide, [place]. We review the chart and discussed the patient on rounds. I agree with his findings, examination and recommendations. The patient had an episode roughly 3 weeks ago in the setting of a viral GI bug where she was having significant projectile vomiting all day long. She then woke up that night went  to the bathroom and again had emesis. Shortly thereafter she stood up and probably passed out that she woke up the next morning with a black eye.  There is no indication that she had any rapid irregular heartbeats or palpitations to suggest a arrhythmogenic etiology. Most clearly this is related to vasovagal mediated vomiting all day long. I don't think any significant cardiology evaluation is required here echocardiogram is normal. Provided she doesn't have any bad arrhythmias on telemetry at least we can sign off. Ensure that she stays completely hydrated.  We will follow her telemetry tomorrow, and if stable I think we can sign off.   Leonie Man, M.D., M.S. Interventional Cardiologist   Pager # [phone]

## 2015-03-22 NOTE — Progress Notes (Signed)
Patient ID: Autumn Lawson, female   DOB: 14-Dec-1940, 74 y.o.   MRN: [id number] BP 150/72 mmHg  Pulse 80  Temp(Src) 98 F (36.7 C) (Oral)  Resp 18  Ht 5\' 4"  (1.626 m)  Wt 59.8 kg (131 lb 13.4 oz)  BMI 22.62 kg/m2  SpO2 99% Repeat head ct looks good.  No new neurosurgical issues. Will sign off, call if there are questions.

## 2015-03-22 NOTE — Progress Notes (Signed)
Physical Therapy Treatment Patient Details Name: Autumn Lawson MRN: [id number] DOB: June 16, 1941 Today's Date: 03/22/2015    History of Present Illness 74 y.o. female admitted with SDH, decreased mobility. She had a fall in December 2015 in which she hit her head. 2 weeks ago she had projectile vomiting and thinks she hit her head on the toilet but isn't sure. She has bruising around R eye and has had vertigo with certain head movements. She'd recently started outpt vestibular PT.     PT Comments    Patient describes spinning sensation related to dizziness.  Patient tested positive for Lt posterior canal BPPV.  Performed Epley canalith repositioning maneuver to treat BPPV.  Vertigo impacting mobility/balance.  Agree with need to continue OP PT for vestibular rehab at discharge.   Follow Up Recommendations  Outpatient PT;Supervision/Assistance - 24 hour (for Vestibular Rehab)     Equipment Recommendations  Cane    Recommendations for Other Services       Precautions / Restrictions Precautions Precautions: Fall Precaution Comments: Dizziness Restrictions Weight Bearing Restrictions: No    Mobility  Bed Mobility Overal bed mobility: Needs Assistance Bed Mobility: Rolling;Sidelying to Sit;Sit to Supine Rolling: Supervision Sidelying to sit: Min guard   Sit to supine: Min guard   General bed mobility comments: Assist for safety due to vertigo with position changes.  Performed Marye Round - patient positive for Left posterior canal BPPV.  Performed Epley canalith repositioning maneuver to treat.   Transfers Overall transfer level: Needs assistance Equipment used: None Transfers: Sit to/from Stand Sit to Stand: Supervision         General transfer comment: Supervision for safety  Ambulation/Gait Ambulation/Gait assistance: Min assist Ambulation Distance (Feet): 42 Feet Assistive device: 1 person hand held assist Gait Pattern/deviations: Step-through pattern;Decreased  stride length Gait velocity: Slow guarded Gait velocity interpretation: Below normal speed for age/gender General Gait Details: Min hand-hold assist for balance due to dizziness   Stairs            Wheelchair Mobility    Modified Rankin (Stroke Patients Only)       Balance                                    Cognition Arousal/Alertness: Awake/alert Behavior During Therapy: WFL for tasks assessed/performed;Anxious Overall Cognitive Status: Within Functional Limits for tasks assessed (Repeats herself at times.  Reports feeling "foggy")                      Exercises      General Comments        Pertinent Vitals/Pain Pain Assessment: No/denies pain    Home Living                      Prior Function            PT Goals (current goals can now be found in the care plan section) Progress towards PT goals: Progressing toward goals    Frequency  Min 3X/week    PT Plan Current plan remains appropriate    Co-evaluation             End of Session Equipment Utilized During Treatment: Gait belt Activity Tolerance: Patient tolerated treatment well (Slight nausea with Vertigo testing/treatment) Patient left: in chair;with call bell/phone within reach;with family/visitor present     Time: 1449-1531 PT Time Calculation (min) (ACUTE ONLY): 42  min  Charges:  $Gait Training: 8-22 mins $Therapeutic Activity: 8-22 mins $Canalith Rep Proc: 8-22 mins                    G Codes:      Despina Pole 04/01/15, 6:43 PM Carita Pian. Sanjuana Kava, [place] Pager [phone]

## 2015-03-22 NOTE — Progress Notes (Signed)
PROGRESS NOTE  Autumn Lawson ZPH:[id number] DOB: 1941/05/06 DOA: 03/20/2015 PCP: Andres Shad, MD   Brief history 74 year old female with a history of depression and vertigo presented to the emergency department after an outpatient MRI [place], [place]) revealed that she had a subdural hematoma. The patient's husband supplements this history at the bedside. In December 2015, the patient had a mechanical fall secondary to a wet floor resulting in facial hematoma. According to the family, the patient has had a history of vertigo for number of years and was undergoing vestibular therapy with physical therapy and outpatient setting. Approximately 2 weeks prior to this admission, the patient woke up in the middle of the night with nausea and vomiting. She did not recall all the events, but the following morning, she woke up with bruises on her face and right periorbital area. Since then, the patient has had worsening vertigo and unsteady gait. As a result they went to the family doctor who ordered an outpatient MRI.  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 Syncope -Consult cardiology for possible need of event monitor although I believe that the patient's syncope is likely due to vasovagal response versus orthostatic hypotension -Telemetry does not reveal any concerning dysrhythmia -Echocardiogram results pending Gait Instability/Vertigo -Multifactorial including the patient's underlying BPPV, orthostatic hypotension, and SDH -Husband wants neurology evaluation which I have requested-->no further neurologic testing -?question syncope from 2 weeks ago -orthostatic vitals--positive -echo--results pending -no concerning dysrhythmia on telemetry  -PT-->outpt PT -Urinalysis negative for pyuria -Check  TSH--635 -B12--1.406 -I spent an inordinate amount of time with the patient's family discussing the patient's disposition.  At this time, I feel that it is safe for the patient to be discharged home with her daughter. Her daughter is agreeable to take care of her for the next several weeks  orthostatic hypotension  -IVF -pt does not want to be placed on any additional medications Depression -continue paxil    Family Communication: Husband and daughter update at beside >35 min spent, >50% spent counseling and coordinating care Disposition Plan: Home 03/23/15 if cleared by cardiology    Procedures/Studies: Ct Head Wo Contrast  03/22/2015   CLINICAL DATA:  Subdural hematoma.  EXAM: CT HEAD WITHOUT CONTRAST  TECHNIQUE: Contiguous axial images were obtained from the base of the skull through the vertex without intravenous contrast.  COMPARISON:  None.  FINDINGS: The anterior right frontal convexity subdural hematoma is not significantly changed. 3 mm of midline shift is again noted. No new hemorrhage is present. Lower density blood is seen over the more superior aspect of the convexity.  Atrophy and white matter disease is stable. No acute cortical infarct is present. The basal ganglia are intact. The ventricles are proportionate to the degree of atrophy.  The paranasal sinuses and mastoid air cells are clear. The calvarium is intact. Bilateral lens extractions are noted. The globes and orbits are otherwise within normal limits.  IMPRESSION: 1. Stable size in appearance of right anterior subdural hematoma. 2. 3 mm of midline shift is stable. 3. Stable atrophy and white matter disease.   Electronically Signed   By: San Morelle M.D.   On: 03/22/2015 07:39   Ct Head Wo Contrast  03/20/2015   CLINICAL DATA:  Patient fell 2-3 weeks ago. Patient is poor historian. Patient is anticoagulated. Cognitive decline with confusion.  EXAM: CT HEAD WITHOUT CONTRAST  TECHNIQUE: Contiguous axial images  were obtained from the base of the skull through the vertex without intravenous contrast.  COMPARISON:  Outside MRI from [place]. This was performed 03/19/2015.  FINDINGS: There is a mixed attenuation subdural collection over the RIGHT frontal lobe. The collection is predominantly hypoattenuating suggesting chronicity. In the more dependent portion, near the pterion, the collection becomes iso to hyperattenuating. As measured on image 22 series 201, thickness is 7 mm. Slight flattening of the RIGHT frontal gyri. Minimal effacement of the RIGHT medial interhemispheric CSF. Midline shift estimated 2 mm as measured at the septum pellucidum.  Generalized atrophy. Chronic microvascular ischemic change of a moderate to advance nature. No acute cortical infarction. Extracranial soft tissues unremarkable. No skull fracture specifically.  IMPRESSION: Mixed attenuation subdural collection over the RIGHT frontal lobe consistent with a predominantly chronic subdural hematoma. 7 mm thickness. Similar appearance to yesterday's MR. Minimal right-to-left shift of 2 mm. Continued surveillance is warranted.   Electronically Signed   By: Rolla Flatten M.D.   On: 03/20/2015 19:47         Subjective: Patient denies fevers, chills, headache, chest pain, dyspnea, nausea, vomiting, diarrhea, abdominal pain, dysuria, hematuria.  She states that her vertigo is somewhat better than yesterday.    Objective: Filed Vitals:   03/21/15 2127 03/21/15 2249 03/22/15 0511 03/22/15 0603  BP: 140/75  120/52 135/70  Pulse: 68  57   Temp: 97.6 F (36.4 C) 97.8 F (36.6 C) 97.9 F (36.6 C)   TempSrc: Oral Oral Oral   Resp: 18  16   Height:      Weight:      SpO2: 95%  97%     Intake/Output Summary (Last 24 hours) at 03/22/15 1343 Last data filed at 03/22/15 0943  Gross per 24 hour  Intake    660 ml  Output   3900 ml  Net  -3240 ml   Weight change:  Exam:   General:  Pt is alert, follows commands appropriately, not in  acute distress  HEENT: No icterus, No thrush, No neck mass, [place]/AT  Cardiovascular: RRR, S1/S2, no rubs, no gallops  Respiratory: CTA bilaterally, no wheezing, no crackles, no rhonchi  Abdomen: Soft/+BS, non tender, non distended, no guarding  Extremities: No edema, No lymphangitis, No petechiae, No rashes, no synovitis  Data Reviewed: Basic Metabolic Panel:  Recent Labs Lab 03/20/15 1843 03/21/15 0530  NA 140 143  K 3.6 3.7  CL 107 108  CO2 25 26  GLUCOSE 98 93  BUN 15 12  CREATININE 0.69 0.67  CALCIUM 9.2 9.0   Liver Function Tests: No results for input(s): AST, ALT, ALKPHOS, BILITOT, PROT, ALBUMIN in the last 168 hours. No results for input(s): LIPASE, AMYLASE in the last 168 hours. No results for input(s): AMMONIA in the last 168 hours. CBC:  Recent Labs Lab 03/20/15 1843 03/21/15 0530 03/22/15 0349  WBC 7.5 6.3 6.7  NEUTROABS 4.5  --   --   HGB 14.2 13.1 13.6  HCT 42.6 39.9 40.3  MCV 97.7 98.3 98.3  PLT 224 217 218   Cardiac Enzymes: No results for input(s): CKTOTAL, CKMB, CKMBINDEX, TROPONINI in the last 168 hours. BNP: Invalid input(s): POCBNP CBG: No results for input(s): GLUCAP in the last 168 hours.  No results found for this or any previous visit (from the past 240 hour(s)).   Scheduled Meds: . docusate sodium  100 mg Oral BID  . PARoxetine  5 mg  Oral Daily  . sodium chloride  3 mL Intravenous Q12H   Continuous Infusions:    Sharryn Belding, DO  [facility] Pager [phone]  If 7PM-7AM, please contact night-coverage [email or web address] Password [email or web address] 03/22/2015, 1:43 PM   LOS: 1 day

## 2015-03-23 DIAGNOSIS — S065X9A Traumatic subdural hemorrhage with loss of consciousness of unspecified duration, initial encounter: Secondary | ICD-10-CM

## 2015-03-23 DIAGNOSIS — S065XAA Traumatic subdural hemorrhage with loss of consciousness status unknown, initial encounter: Secondary | ICD-10-CM | POA: Insufficient documentation

## 2015-03-23 LAB — BASIC METABOLIC PANEL
Anion gap: 11 (ref 5–15)
BUN: 12 mg/dL (ref 6–20)
CHLORIDE: 107 mmol/L (ref 101–111)
CO2: 23 mmol/L (ref 22–32)
Calcium: 9 mg/dL (ref 8.9–10.3)
Creatinine, Ser: 0.57 mg/dL (ref 0.44–1.00)
GFR calc Af Amer: >60 mL/min (ref >60)
GFR calc non Af Amer: >60 mL/min (ref >60)
GLUCOSE: 97 mg/dL (ref 65–99)
Potassium: 4.1 mmol/L (ref 3.5–5.1)
Sodium: 141 mmol/L (ref 135–145)

## 2015-03-23 NOTE — Discharge Summary (Signed)
Physician Discharge Summary  Autumn Lawson MAU:[id number] DOB: May 09, 1941 DOA: 03/20/2015  PCP: Andres Shad, MD  Admit date: 03/20/2015 Discharge date: 03/23/2015  Recommendations for Outpatient Follow-up:  1. 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 -repeat CT brain 03/22/15--stable SDH Syncope -Consult cardiology for possible need of event monitor although I believe that the patient's syncope is likely due to vasovagal response versus orthostatic hypotension -Cardiology agreed and did not feel any further cardiac testing was necessary -Telemetry does not reveal any concerning dysrhythmia -Echocardiogram--EF 60%, no wall motion abnormalities Gait Instability/Vertigo -Multifactorial including the patient's underlying BPPV, orthostatic hypotension, and SDH -Husband wants neurology evaluation which I have requested-->no further neurologic testing -pt likely had syncope from 2 weeks ago when she woke up with facial bruising -orthostatic vitals--positive -echo--as above -no concerning dysrhythmia on telemetry  -At the husband's request, neurology and cardiology were consulted.  -Neurology agreed that the patient's vertigo was likely a combination of her underlying BPPV as well as her subdural hematoma which may have resulted in worsening. He did not recommend any further testing  -PT-->outpt PT -Urinalysis negative for pyuria -Check TSH--635 -B12--1.406 -I spent an inordinate amount of time with the patient's family discussing the patient's disposition. At this time, I feel that it is safe for the patient to be discharged home with her daughter. Her daughter is agreeable to take care of her for the next several weeks  orthostatic hypotension   -IVF -pt does not want to be placed on any additional medications -dizziness improved with PT therapy and IVF during the hospitalization Depression -continue paxil  Discharge Condition: stable  Disposition: home Follow-up Information    Follow up with [facility].   Specialty:  Rehabilitation   Why:  vestibular physical therapy, they will call you if you dont' hear from them please call them   Contact information:   [place] [place] [address] [phone]       Diet:regular Wt Readings from Last 3 Encounters:  03/20/15 59.8 kg (131 lb 13.4 oz)  07/02/11 60.782 kg (134 lb)  05/15/11 61.236 kg (135 lb)    History of present illness:  73 year old female with a history of depression and vertigo presented to the emergency department after an outpatient MRI [place], [place]) revealed that she had a subdural hematoma. The patient's husband supplements this history at the bedside. In December 2015, the patient had a mechanical fall secondary to a wet floor resulting in facial hematoma. According to the family, the patient has had a history of vertigo for number of years and was undergoing vestibular therapy with physical therapy and outpatient setting. Approximately 2 weeks prior to this admission, the patient woke up in the middle of the night with nausea and vomiting. She did not recall all the events, but the following morning, she woke up with bruises on her face and right periorbital area. Since then, the patient has had worsening vertigo and unsteady gait. As a result they went to the family doctor who ordered an outpatient MRI.     Consultants: Neurosurgery Neurology cardiology  Discharge Exam: Filed Vitals:   03/23/15 0513  BP: 131/67  Pulse: 70  Temp: 98 F (36.7 C)  Resp: 16   Filed Vitals:  03/22/15 1454 03/22/15 1456 03/22/15 2213 03/23/15 0513  BP: 144/69 150/72 158/86 131/67   Pulse: 88 80 58 70  Temp:   97.7 F (36.5 C) 98 F (36.7 C)  TempSrc:   Oral Oral  Resp:   18 16  Height:      Weight:      SpO2: 98% 99% 99% 98%   General: A&O x 3, NAD, pleasant, cooperative Cardiovascular: RRR, no rub, no gallop, no S3 Respiratory: CTAB, no wheeze, no rhonchi Abdomen:soft, nontender, nondistended, positive bowel sounds Extremities: No edema, No lymphangitis, no petechiae  Discharge Instructions  Discharge Instructions    Diet - low sodium heart healthy    Complete by:  As directed      Increase activity slowly    Complete by:  As directed             Medication List    TAKE these medications        AMBULATORY NON FORMULARY MEDICATION  BioIdentical Hormones cream apply once a day     b complex vitamins tablet  Take 1 tablet by mouth 3 (three) times a week.     CALCIUM-MAG-VIT C-VIT D PO  Take 6 capsules by mouth daily.     fish oil-omega-3 fatty acids 1000 MG capsule  Take 3 by mouth once a day     OVER THE COUNTER MEDICATION  Take 1 tablet by mouth at bedtime. "Bone Restore" supplement     PARoxetine 10 MG tablet  Commonly known as:  PAXIL  Take 5 mg by mouth every morning.     POMEGRANATE PO  Take 1 capsule by mouth daily as needed (supplement).     psyllium 28 % packet  Commonly known as:  METAMUCIL SMOOTH TEXTURE  Take 1 packet by mouth at bedtime.     sucralfate 1 G tablet  Commonly known as:  CARAFATE  Take 1 g by mouth as needed.     tretinoin 0.05 % cream  Commonly known as:  RETIN-A  Apply topically at bedtime.         The results of significant diagnostics from this hospitalization (including imaging, microbiology, ancillary and laboratory) are listed below for reference.    Significant Diagnostic Studies: Ct Head Wo Contrast  03/22/2015   CLINICAL DATA:  Subdural hematoma.  EXAM: CT HEAD WITHOUT CONTRAST  TECHNIQUE: Contiguous axial images were obtained from the base of the skull through the vertex without  intravenous contrast.  COMPARISON:  None.  FINDINGS: The anterior right frontal convexity subdural hematoma is not significantly changed. 3 mm of midline shift is again noted. No new hemorrhage is present. Lower density blood is seen over the more superior aspect of the convexity.  Atrophy and white matter disease is stable. No acute cortical infarct is present. The basal ganglia are intact. The ventricles are proportionate to the degree of atrophy.  The paranasal sinuses and mastoid air cells are clear. The calvarium is intact. Bilateral lens extractions are noted. The globes and orbits are otherwise within normal limits.  IMPRESSION: 1. Stable size in appearance of right anterior subdural hematoma. 2. 3 mm of midline shift is stable. 3. Stable atrophy and white matter disease.   Electronically Signed   By: San Morelle M.D.   On: 03/22/2015 07:39   Ct Head Wo Contrast  03/20/2015   CLINICAL DATA:  Patient fell 2-3 weeks ago. Patient is poor historian. Patient is anticoagulated. Cognitive decline with confusion.  EXAM: CT HEAD WITHOUT  CONTRAST  TECHNIQUE: Contiguous axial images were obtained from the base of the skull through the vertex without intravenous contrast.  COMPARISON:  Outside MRI from [place]. This was performed 03/19/2015.  FINDINGS: There is a mixed attenuation subdural collection over the RIGHT frontal lobe. The collection is predominantly hypoattenuating suggesting chronicity. In the more dependent portion, near the pterion, the collection becomes iso to hyperattenuating. As measured on image 22 series 201, thickness is 7 mm. Slight flattening of the RIGHT frontal gyri. Minimal effacement of the RIGHT medial interhemispheric CSF. Midline shift estimated 2 mm as measured at the septum pellucidum.  Generalized atrophy. Chronic microvascular ischemic change of a moderate to advance nature. No acute cortical infarction. Extracranial soft tissues unremarkable. No skull fracture specifically.   IMPRESSION: Mixed attenuation subdural collection over the RIGHT frontal lobe consistent with a predominantly chronic subdural hematoma. 7 mm thickness. Similar appearance to yesterday's MR. Minimal right-to-left shift of 2 mm. Continued surveillance is warranted.   Electronically Signed   By: Rolla Flatten M.D.   On: 03/20/2015 19:47     Microbiology: No results found for this or any previous visit (from the past 240 hour(s)).   Labs: Basic Metabolic Panel:  Recent Labs Lab 03/20/15 1843 03/21/15 0530 03/23/15 0551  NA 140 143 141  K 3.6 3.7 4.1  CL 107 108 107  CO2 25 26 23   GLUCOSE 98 93 97  BUN 15 12 12   CREATININE 0.69 0.67 0.57  CALCIUM 9.2 9.0 9.0   Liver Function Tests: No results for input(s): AST, ALT, ALKPHOS, BILITOT, PROT, ALBUMIN in the last 168 hours. No results for input(s): LIPASE, AMYLASE in the last 168 hours. No results for input(s): AMMONIA in the last 168 hours. CBC:  Recent Labs Lab 03/20/15 1843 03/21/15 0530 03/22/15 0349  WBC 7.5 6.3 6.7  NEUTROABS 4.5  --   --   HGB 14.2 13.1 13.6  HCT 42.6 39.9 40.3  MCV 97.7 98.3 98.3  PLT 224 217 218   Cardiac Enzymes: No results for input(s): CKTOTAL, CKMB, CKMBINDEX, TROPONINI in the last 168 hours. BNP: Invalid input(s): POCBNP CBG: No results for input(s): GLUCAP in the last 168 hours.  Time coordinating discharge:  Greater than 30 minutes  Signed:  Fantasha Daniele, DO [facility] Pager: [phone] 03/23/2015, 11:09 AM

## 2015-03-23 NOTE — Progress Notes (Signed)
Reviewed discharge paperwork with pt and family.  Prescription for outpt PT given to family.  Pt requested release of medical information for personal records.  Informed family about MyChart and also faxed a request to medical records.  Pt denied any other needs at this time.  Pt taken to discharge location via wheelchair.

## 2015-03-23 NOTE — Progress Notes (Signed)
Physical Therapy Treatment Patient Details Name: Autumn Lawson MRN: [id number] DOB: 1941/10/12 Today's Date: 03/23/2015    History of Present Illness 74 y.o. female admitted with SDH, decreased mobility. She had a fall in December 2015 in which she hit her head. 2 weeks ago she had projectile vomiting and thinks she hit her head on the toilet but isn't sure. She has bruising around R eye and has had vertigo with certain head movements. She'd recently started outpt vestibular PT.     PT Comments    Patient reports improvements today.  No spinning sensation.  Roll tests and Marye Round tests were negative for BPPV today.  Remains slightly unsteady with gait.  Continue to recommend OP PT f/u.  Patient going to daughter's home at discharge.  Ready for d/c from PT perspective.  MD:  Patient will need prescription for OP PT for Vestibular/Balance Rehab.   Follow Up Recommendations  Outpatient PT;Supervision/Assistance - 24 hour (for Vestibular Rehab)     Equipment Recommendations  None recommended by PT    Recommendations for Other Services       Precautions / Restrictions Precautions Precautions: Fall Precaution Comments: Discussed moving slowly with position changes.   Restrictions Weight Bearing Restrictions: No    Mobility  Bed Mobility Overal bed mobility: Modified Independent Bed Mobility: Rolling;Sidelying to Sit;Sit to Supine Rolling: Modified independent (Device/Increase time) Sidelying to sit: Modified independent (Device/Increase time)   Sit to supine: Modified independent (Device/Increase time)   General bed mobility comments: No assist needed.  No spinning today, with improved balance/safety.  Transfers Overall transfer level: Needs assistance Equipment used: None Transfers: Sit to/from Stand Sit to Stand: Supervision         General transfer comment: Supervision for safety  Ambulation/Gait Ambulation/Gait assistance: Min guard Ambulation Distance (Feet):  50 Feet Assistive device: None Gait Pattern/deviations: Step-through pattern;Decreased stride length Gait velocity: Decreased Gait velocity interpretation: Below normal speed for age/gender General Gait Details: No physical assist needed. Min guard for safety.  Slightly unsteady, but no loss of balance.   Stairs            Wheelchair Mobility    Modified Rankin (Stroke Patients Only)       Balance             Standing balance-Leahy Scale: Good                      Cognition Arousal/Alertness: Awake/alert Behavior During Therapy: WFL for tasks assessed/performed;Anxious Overall Cognitive Status: Within Functional Limits for tasks assessed (Continues c/o feeling "foggy". Difficulty following directio)                      Exercises Other Exercises Other Exercises: Oculomotor:  Normal smooth pursuits.  Normal VOR and head thrust. Other Exercises: Roll Test:  Normal to Lt and Rt Other Exercises: Marye Round:  No nystagmus or dizziness noted to Lt or to Rt. Other Exercises: Symptoms:  Patient reports no spinning today.    General Comments        Pertinent Vitals/Pain Pain Assessment: No/denies pain    Home Living                      Prior Function            PT Goals (current goals can now be found in the care plan section) Acute Rehab PT Goals Patient Stated Goal: to return to working out Progress towards PT  goals: Progressing toward goals    Frequency  Min 3X/week    PT Plan Current plan remains appropriate    Co-evaluation             End of Session   Activity Tolerance: Patient tolerated treatment well Patient left: in bed;with call bell/phone within reach;with family/visitor present (sitting EOB to dress for d/c)     Time: 1036-1100 PT Time Calculation (min) (ACUTE ONLY): 24 min  Charges:  $Therapeutic Activity: 23-37 mins                    G Codes:      Autumn Lawson 2015-03-26, 1:07 PM Autumn Pian.  Sanjuana Lawson, Autumn Lawson Pager [phone]

## 2015-04-22 ENCOUNTER — Encounter: Payer: Self-pay | Admitting: Neurology

## 2015-04-22 ENCOUNTER — Ambulatory Visit (INDEPENDENT_AMBULATORY_CARE_PROVIDER_SITE_OTHER): Payer: Medicare Other | Admitting: Neurology

## 2015-04-22 ENCOUNTER — Telehealth: Payer: Self-pay | Admitting: Neurology

## 2015-04-22 VITALS — BP 137/71 | HR 63 | Ht 64.0 in | Wt 133.4 lb

## 2015-04-22 DIAGNOSIS — S065X9A Traumatic subdural hemorrhage with loss of consciousness of unspecified duration, initial encounter: Secondary | ICD-10-CM

## 2015-04-22 DIAGNOSIS — R42 Dizziness and giddiness: Secondary | ICD-10-CM | POA: Diagnosis not present

## 2015-04-22 DIAGNOSIS — R2681 Unsteadiness on feet: Secondary | ICD-10-CM | POA: Diagnosis not present

## 2015-04-22 DIAGNOSIS — I6200 Nontraumatic subdural hemorrhage, unspecified: Secondary | ICD-10-CM | POA: Diagnosis not present

## 2015-04-22 DIAGNOSIS — R413 Other amnesia: Secondary | ICD-10-CM

## 2015-04-22 DIAGNOSIS — R55 Syncope and collapse: Secondary | ICD-10-CM | POA: Insufficient documentation

## 2015-04-22 DIAGNOSIS — S065XAA Traumatic subdural hemorrhage with loss of consciousness status unknown, initial encounter: Secondary | ICD-10-CM

## 2015-04-22 HISTORY — DX: Other amnesia: R41.3

## 2015-04-22 NOTE — Telephone Encounter (Signed)
I called the patient. I explained that Dr. Jannifer Franklin put in a referral for neuro rehab and they should be contacting her soon. She verbalized understanding.

## 2015-04-22 NOTE — Patient Instructions (Signed)
Syncope °Syncope is a medical term for fainting or passing out. This means you lose consciousness and drop to the ground. People are generally unconscious for less than 5 minutes. You may have some muscle twitches for up to 15 seconds before waking up and returning to normal. Syncope occurs more often in older adults, but it can happen to anyone. While most causes of syncope are not dangerous, syncope can be a sign of a serious medical problem. It is important to seek medical care.  °CAUSES  °Syncope is caused by a sudden drop in blood flow to the brain. The specific cause is often not determined. Factors that can bring on syncope include: °· Taking medicines that lower blood pressure. °· Sudden changes in posture, such as standing up quickly. °· Taking more medicine than prescribed. °· Standing in one place for too long. °· Seizure disorders. °· Dehydration and excessive exposure to heat. °· Low blood sugar (hypoglycemia). °· Straining to have a bowel movement. °· Heart disease, irregular heartbeat, or other circulatory problems. °· Fear, emotional distress, seeing blood, or severe pain. °SYMPTOMS  °Right before fainting, you may: °· Feel dizzy or light-headed. °· Feel nauseous. °· See all white or all black in your field of vision. °· Have cold, clammy skin. °DIAGNOSIS  °Your health care provider will ask about your symptoms, perform a physical exam, and perform an electrocardiogram (ECG) to record the electrical activity of your heart. Your health care provider may also perform other heart or blood tests to determine the cause of your syncope which may include: °· Transthoracic echocardiogram (TTE). During echocardiography, sound waves are used to evaluate how blood flows through your heart. °· Transesophageal echocardiogram (TEE). °· Cardiac monitoring. This allows your health care provider to monitor your heart rate and rhythm in real time. °· Holter monitor. This is a portable device that records your  heartbeat and can help diagnose heart arrhythmias. It allows your health care provider to track your heart activity for several days, if needed. °· Stress tests by exercise or by giving medicine that makes the heart beat faster. °TREATMENT  °In most cases, no treatment is needed. Depending on the cause of your syncope, your health care provider may recommend changing or stopping some of your medicines. °HOME CARE INSTRUCTIONS °· Have someone stay with you until you feel stable. °· Do not drive, use machinery, or play sports until your health care provider says it is okay. °· Keep all follow-up appointments as directed by your health care provider. °· Lie down right away if you start feeling like you might faint. Breathe deeply and steadily. Wait until all the symptoms have passed. °· Drink enough fluids to keep your urine clear or pale yellow. °· If you are taking blood pressure or heart medicine, get up slowly and take several minutes to sit and then stand. This can reduce dizziness. °SEEK IMMEDIATE MEDICAL CARE IF:  °· You have a severe headache. °· You have unusual pain in the chest, abdomen, or back. °· You are bleeding from your mouth or rectum, or you have black or tarry stool. °· You have an irregular or very fast heartbeat. °· You have pain with breathing. °· You have repeated fainting or seizure-like jerking during an episode. °· You faint when sitting or lying down. °· You have confusion. °· You have trouble walking. °· You have severe weakness. °· You have vision problems. °If you fainted, call your local emergency services (911 in [place] Do not drive   yourself to the hospital.  °MAKE SURE YOU: °· Understand these instructions. °· Will watch your condition. °· Will get help right away if you are not doing well or get worse. °Document Released: 09/28/2005 Document Revised: 10/03/2013 Document Reviewed: 11/27/2011 °Exit[employer]® Patient Information ©2015 [employer]. This information is not intended to replace  advice given to you by your health care provider. Make sure you discuss any questions you have with your health care provider. ° °

## 2015-04-22 NOTE — Progress Notes (Signed)
Reason for visit: Subdural hematoma  Referring physician: Dr. Radene [name] is a 74 y.o. female  History of present illness:  Autumn Lawson is a 74 year old right-handed white female with a history of a fall that occurred in December 2015. The patient slipped on a wet floor, and fell forward striking the left cheek area. The patient denied any loss of consciousness with this. She had done relatively well until the third week in May 2016. The patient began having some problems with vertigo. The patient has had vertigo previously, she was treated 2 years prior for benign positional vertigo with the Epley maneuvers with good improvement. The patient indicated that any head movement would bring on the true vertigo unassociated with nausea or vomiting. The patient was admitted to the hospital on 03/20/2015 with headaches and vertigo. The patient had been evaluated 10 days prior after a syncopal event. The patient had a viral illness associated with nausea and vomiting. The patient had gotten up during the night and vomited. The patient apparently lost consciousness and fell striking the right side the head with bruising around the right eye. The patient has no recollection of the fall. She underwent a CT head scan and MRI brain evaluation showing a small right subdural hematoma. This appeared to be chronic in nature. The patient has had occasional headaches since that time and she will take Tylenol if needed. The patient has gotten much better with the vertigo, but she occasionally will have an episode of vertigo with bending or stooping. The patient has had a two-year history of slight forgetfulness, she was evaluated by a neurologist in the [facility] area 2 years ago. The patient had formal neuropsychological evaluation, she was told she had minimum cognitive impairment. There has been some ongoing cognitive issues since that time. The patient is at her baseline mental status  at this time. She continues to pay the bills, operate a motor vehicle. She has not given up any activities of daily living secondary to memory problems. The patient is sent to this office for an evaluation. The patient reports some tingling sensations on the head, but she denies numbness or weakness of the extremities. She denies issues controlling the bowels or the bladder and no problems with balance as long as she is not having active vertigo.  Past Medical History  Diagnosis Date  . ADD (attention deficit disorder with hyperactivity)   . Depression with anxiety   . Vertigo   . PTSD (post-traumatic stress disorder)   . Esophageal reflux   . Gallstone   . Skin cancer, basal cell   . Elevated LFTs   . Vertigo 03/2015  . Hematoma   . Memory deficits 04/22/2015  . Varicose vein of leg     bilateral    Past Surgical History  Procedure Laterality Date  . Varicose vein surgery    . Cesarean section      x4  . Cataract extraction      bilateral  . Abdominal hysterectomy    . Back surgery      rod in back placed  . Liver surgery  1984  . Cholecystectomy  1981    Family History  Problem Relation Age of Onset  . Leukemia Father   . Prostate cancer Other     paternal [place] grandfather  . Alzheimer's disease Mother   . Heart failure Sister   . Healthy Brother   . Healthy Sister   . Healthy  Sister   . Healthy Brother     Social history:  reports that she quit smoking about 38 years ago. She has never used smokeless tobacco. She reports that she drinks alcohol. She reports that she does not use illicit drugs.  Medications:  Prior to Admission medications   Medication Sig Start Date End Date Taking? Authorizing Provider  AMBULATORY NON FORMULARY MEDICATION BioIdentical Hormones cream apply once a day     Historical Provider, MD  b complex vitamins tablet Take 1 tablet by mouth 3 (three) times a week.      Historical Provider, MD  CALCIUM-MAG-VIT C-VIT D PO Take 6 capsules by  mouth daily.      Historical Provider, MD  fish oil-omega-3 fatty acids 1000 MG capsule Take 3 by mouth once a day     Historical Provider, MD  OVER THE COUNTER MEDICATION Take 1 tablet by mouth at bedtime. "Bone Restore" supplement    Historical Provider, MD  PARoxetine (PAXIL) 10 MG tablet Take 5 mg by mouth every morning.     Historical Provider, MD  Pomegranate, Punica granatum, (POMEGRANATE PO) Take 1 capsule by mouth daily as needed (supplement).    Historical Provider, MD  psyllium (METAMUCIL SMOOTH TEXTURE) 28 % packet Take 1 packet by mouth at bedtime.    Historical Provider, MD  sucralfate (CARAFATE) 1 G tablet Take 1 g by mouth as needed.      Historical Provider, MD  tretinoin (RETIN-A) 0.05 % cream Apply topically at bedtime.      Historical Provider, MD      Allergies  Allergen Reactions  . Levaquin [Levofloxacin In D5w] Other (See Comments)    Joint swelling    ROS:  Out of a complete 14 system review of symptoms, the patient complains only of the following symptoms, and all other reviewed systems are negative.  Fatigue Vertigo Easy bruising, feeling cold Memory loss, headache, weakness, dizziness Depression, anxiety, decreased energy  Blood pressure 137/71, pulse 63, height 5\' 4"  (1.626 m), weight 133 lb 6.4 oz (60.51 kg).  Physical Exam  General: The patient is alert and cooperative at the time of the examination.  Eyes: Pupils are equal, round, and reactive to light. Discs are flat bilaterally.  Neck: The neck is supple, no carotid bruits are noted.  Respiratory: The respiratory examination is clear.  Cardiovascular: The cardiovascular examination reveals a regular rate and rhythm, no obvious murmurs or rubs are noted.  Skin: Extremities are without significant edema. Varicose veins of the legs are noted bilaterally, left greater than right.  Neurologic Exam  Mental status: The patient is alert and oriented x 3 at the time of the examination. The patient  has apparent normal recent and remote memory, with an apparently normal attention span and concentration ability. Mini-Mental Status Examination done today shows a total score 25/30. The patient is able to name 11 animals in 30 seconds.  Cranial nerves: Facial symmetry is present. There is good sensation of the face to pinprick and soft touch bilaterally. The strength of the facial muscles and the muscles to head turning and shoulder shrug are normal bilaterally. Speech is well enunciated, no aphasia or dysarthria is noted. Extraocular movements are full. Visual fields are full. The tongue is midline, and the patient has symmetric elevation of the soft palate. No obvious hearing deficits are noted.  Motor: The motor testing reveals 5 over 5 strength of all 4 extremities. Good symmetric motor tone is noted throughout.  Sensory: Sensory testing  is intact to pinprick, soft touch, vibration sensation, and position sense on all 4 extremities. No evidence of extinction is noted.  Coordination: Cerebellar testing reveals good finger-nose-finger and heel-to-shin bilaterally.  Gait and station: Gait is normal. Tandem gait is normal. Romberg is negative. No drift is seen.  Reflexes: Deep tendon reflexes are symmetric and normal bilaterally. Toes are downgoing bilaterally.   CT head 03/22/15:  IMPRESSION: 1. Stable size in appearance of right anterior subdural hematoma. 2. 3 mm of midline shift is stable. 3. Stable atrophy and white matter disease.  * CT scan images were reviewed online. I agree with the written report.    Assessment/Plan:  1. Benign positional vertigo  2. Small right subdural hematoma, chronic  3. Syncope, probable vasovagal syncope  4. Minimum cognitive impairment  The patient is still having some episodes of vertigo, we will refer her to physical therapy for vestibular rehabilitation. The patient will undergo an EEG study given the subdural hematoma and the episode of  syncope. The patient will follow-up in 6 months, we will follow the memory issues over time.  Jill Alexanders MD 04/22/2015 6:14 PM  [facility] [address] [place], [place] [address]  Phone [phone] Fax [phone]

## 2015-04-22 NOTE — Telephone Encounter (Signed)
Patient is calling and stated that the doctor suggested that she have the epley manuver for vertigo. Will that test be scheduled here or somewhere else.  Please call her to advise.  Thanks

## 2015-04-23 ENCOUNTER — Telehealth: Payer: Self-pay | Admitting: Neurology

## 2015-04-23 NOTE — Procedures (Signed)
      History: Autumn Lawson is a 74 year old patient with a history of a right frontal chronic subdural hematoma. The patient fell in December 2015, and she had another fall that she did not recall on 03/20/2015. The patient may have suffered a syncopal event. She is being evaluated for this episode.  This is a routine EEG. No skull defects are noted. Medications include calcium supplementation, Paxil, and Metamucil.  EEG classification: Dysrhythmia grade 1 left temporal  Description of the recording: The background rhythms of this recording consists of a relatively well modulated medium amplitude alpha rhythm of 8 Hz that is reactive to eye opening and closure. As the record progresses, photic stimulation is performed, and this results in a bilateral photic driving response. Hyperventilation is also performed resulting in a minimal buildup of the background rhythm activities without significant slowing seen. At times during the recording, the patient appears to enter the drowsy state with diffuse symmetric background slowing. On occasion, asymmetric theta slowing is seen emanating from the left mid temporal area, but this slowing is not persistent. At no time during the recording does there appear to be evidence of spike or spike-wave discharges. EKG monitor shows no evidence of cardiac rhythm abnormalities with a heart rate of 60.  Impression: This is a minimally abnormal EEG recording secondary to mild intermittent asymmetric theta slowing emanating from the left temporal region. This study suggests a mild left brain abnormality, but no clear epileptiform discharges are seen. This does not correlate with the right frontal subdural hematoma. Clinical correlation is required.

## 2015-04-23 NOTE — Telephone Encounter (Signed)
I called patient. The EEG study showed very minimal left temporal slowing, clinical significance of this is not clear. I would not place the patient on anti-epileptic medications, but rather I would follow her conservatively. If she has episodes of altered mental status, she is to contact me. Otherwise, she will follow-up in 6 months. She will get vestibular rehabilitation.

## 2015-04-23 NOTE — Telephone Encounter (Signed)
Patient called and requested to have Dr. Jannifer Franklin write a referral for PT somewhere closer to her. She lives in [place], [place]. Please call and advise.

## 2015-04-24 ENCOUNTER — Telehealth: Payer: Self-pay | Admitting: Neurology

## 2015-04-24 NOTE — Telephone Encounter (Signed)
I called the patient. She is having some headaches in the morning associated with some sinus congestion, the headaches are throughout the entire head. She has had sinus headaches previously that Sudafed has improved. I indicated that it is okay to take the Sudafed, if the headaches do not seem to be improving, she is to contact our office.

## 2015-04-24 NOTE — Telephone Encounter (Signed)
Patient called stating she believes she has been waking with a headache and is congested in the morning.She believes it to be sinus related. She is inquiring what Dr Autumn Lawson would recommend she take for this as she is concerned about taking anything with hematoma. Please call and advise. Patient can be reached at [phone].

## 2015-05-01 ENCOUNTER — Telehealth: Payer: Self-pay | Admitting: Neurology

## 2015-05-01 ENCOUNTER — Other Ambulatory Visit: Payer: Self-pay

## 2015-05-01 DIAGNOSIS — H8113 Benign paroxysmal vertigo, bilateral: Secondary | ICD-10-CM

## 2015-05-01 NOTE — Telephone Encounter (Signed)
I called the patient and let her know the order has been placed for PT at [facility].

## 2015-05-01 NOTE — Telephone Encounter (Signed)
Patient is calling to get a referral faxed for physical therapy for vertigo to [facility]  fax# [phone]. Thank you.

## 2015-05-01 NOTE — Telephone Encounter (Signed)
Pt called and would like to speak with Dr. Jannifer Franklin about the last visit. She is confused about what was said about her care and about the plan. She would also like to have a print out . Please call and advise [phone]

## 2015-05-02 ENCOUNTER — Telehealth: Payer: Self-pay | Admitting: Neurology

## 2015-05-02 NOTE — Telephone Encounter (Signed)
I called the patient. She was curious what the plan of her care was. I explained to her that Dr. Jannifer Franklin wants her to go to vestibular rehab. He mentioned in her EEG results that he does not wish to start her on seizure medications at this time, but would like for her to contact our office if she has any change in mental status. The patient verbalized understanding.

## 2015-05-02 NOTE — Telephone Encounter (Signed)
Pt called and says the [facility] Re[facility] facility, [facility], did not receive fax for referral . Pt has a scheduled appt for to 7/ 22 at 3:50pm . Please refax referral [phone]. Phone number [phone].

## 2015-05-03 ENCOUNTER — Other Ambulatory Visit: Payer: Self-pay | Admitting: Neurology

## 2015-05-03 ENCOUNTER — Other Ambulatory Visit: Payer: Self-pay

## 2015-05-03 DIAGNOSIS — H8110 Benign paroxysmal vertigo, unspecified ear: Secondary | ICD-10-CM

## 2015-05-03 DIAGNOSIS — R42 Dizziness and giddiness: Secondary | ICD-10-CM

## 2015-05-03 NOTE — Telephone Encounter (Signed)
Referral faxed to the number provided. Confirmation received. I tried to call the patient to let her know but continuously received a busy signal.

## 2015-05-15 ENCOUNTER — Telehealth: Payer: Self-pay | Admitting: Neurology

## 2015-05-15 NOTE — Telephone Encounter (Signed)
I called Dr. Teryl Lucy, I got no response, I will call back tomorrow.

## 2015-05-15 NOTE — Telephone Encounter (Signed)
Dr Ramond Dial called inquring if Dr Jannifer Franklin thought patient was able to drive. Please call and advise. He can be reached at [phone].

## 2015-05-16 NOTE — Telephone Encounter (Signed)
I called the office of Dr. Teryl Lucy, left a message for him. From my standpoint, I see no direct contraindications for this patient returning to driving. Unless the family or the patient has information that I am unaware of regarding driving safety, I would allow her to return to driving.

## 2015-05-21 ENCOUNTER — Telehealth: Payer: Self-pay | Admitting: Neurology

## 2015-05-21 NOTE — Telephone Encounter (Signed)
I have records from admission on 05/13/2015. The patient was being evaluated for multiple falls, syncope, possible seizures. Carotid Doppler study was unremarkable. 2-D echocardiogram shows an ejection fraction of 55-60% ventricular wall motion is normal. MRI of the brain shows a moderate level small vessel disease, no acute intracranial abnormalities were seen. The patient apparently presented on August 1 the patient had an unwitnessed loss of consciousness episode, fell from a breakfast table. Later that evening, she slumped forward and was unresponsive for 1 minute, husband noted some rigidity and trembling. CT scan of the head following the second event showed some minimal extra-axial blood and a left parietal and occipital skull fracture. EEG done during the hospitalization was normal. The patient was seen by neurology, cardiac monitoring was recommended. Discontinuance of Keppra was recommended.  These records from admission were not available to me at the time the primary care physician contacted me regarding driving. Given these recent episodes of syncope, I would not recommend driving for up to 6 months.

## 2015-05-30 ENCOUNTER — Ambulatory Visit (INDEPENDENT_AMBULATORY_CARE_PROVIDER_SITE_OTHER): Payer: Medicare Other | Admitting: Neurology

## 2015-05-30 ENCOUNTER — Encounter: Payer: Self-pay | Admitting: Neurology

## 2015-05-30 VITALS — BP 170/82 | HR 68 | Ht 64.0 in | Wt 135.5 lb

## 2015-05-30 DIAGNOSIS — R42 Dizziness and giddiness: Secondary | ICD-10-CM | POA: Diagnosis not present

## 2015-05-30 DIAGNOSIS — R2681 Unsteadiness on feet: Secondary | ICD-10-CM | POA: Diagnosis not present

## 2015-05-30 DIAGNOSIS — R55 Syncope and collapse: Secondary | ICD-10-CM

## 2015-05-30 DIAGNOSIS — R413 Other amnesia: Secondary | ICD-10-CM

## 2015-05-30 NOTE — Progress Notes (Signed)
Reason for visit: Syncope  Autumn Lawson is an 74 y.o. female  History of present illness:  Autumn Lawson is a 74 year old right-handed white female with a history of multiple syncopal events. The patient was hospitalized recently for 2 syncopal events that occurred on the same day. With the first event, she fell and sustained a skull fracture. The second event occurred while sitting at a breakfast table, she slumped over, and her sister sat her back up, and then she was noted to have brief stiffening and tremor in the arms. The patient did not bite her tongue or lose control the bowels or the bladder. The patient regained consciousness within 15 seconds, and was fully lucid, without confusion. The patient has had some problems with memory and confusion since the fall. She has engaged in physical therapy for her vertigo which has returned. The patient is now living at [facility]. She is not operating a motor vehicle at this time. An EEG study done at the hospital was normal, the study done here showed some mild left-sided slowing. The patient initially had been placed on Keppra but this was stopped. The patient has undergone a 2-D echocardiogram, carotid Doppler study, and MRI of the brain that were relatively unremarkable, mild small vessel disease was noted on the MRI. The patient reports no numbness or weakness on the arms or legs, she denies any further blackout episodes. She does have some urinary urgency at times. She comes to this office for an evaluation.  Past Medical History  Diagnosis Date  . ADD (attention deficit disorder with hyperactivity)   . Depression with anxiety   . Vertigo   . PTSD (post-traumatic stress disorder)   . Esophageal reflux   . Gallstone   . Skin cancer, basal cell   . Elevated LFTs   . Vertigo 03/2015  . Hematoma   . Memory deficits 04/22/2015  . Varicose vein of leg     bilateral    Past Surgical History  Procedure Laterality Date  .  Varicose vein surgery    . Cesarean section      x4  . Cataract extraction      bilateral  . Abdominal hysterectomy    . Back surgery      rod in back placed  . Liver surgery  1984  . Cholecystectomy  1981    Family History  Problem Relation Age of Onset  . Leukemia Father   . Prostate cancer Other     paternal [place] grandfather  . Alzheimer's disease Mother   . Heart failure Sister   . Healthy Brother   . Healthy Sister   . Healthy Sister   . Healthy Brother     Social history:  reports that she quit smoking about 39 years ago. She has never used smokeless tobacco. She reports that she drinks about 4.2 oz of alcohol per week. She reports that she does not use illicit drugs.    Allergies  Allergen Reactions  . Levaquin [Levofloxacin In D5w] Other (See Comments)    Joint swelling    Medications:  Prior to Admission medications   Medication Sig Start Date End Date Taking? Authorizing Provider  AMBULATORY NON FORMULARY MEDICATION BioIdentical Hormones cream apply once a day    Yes Historical Provider, MD  calcium carbonate (TUMS - DOSED IN MG ELEMENTAL CALCIUM) 500 MG chewable tablet Chew 1 tablet by mouth daily.   Yes Historical Provider, MD  CALCIUM-MAG-VIT C-VIT D PO Take  6 capsules by mouth daily.     Yes Historical Provider, MD  PARoxetine (PAXIL) 10 MG tablet Take 5 mg by mouth every morning.    Yes Historical Provider, MD  Pomegranate, Punica granatum, (POMEGRANATE PO) Take 1 capsule by mouth daily as needed (supplement).   Yes Historical Provider, MD  psyllium (METAMUCIL SMOOTH TEXTURE) 28 % packet Take 1 packet by mouth at bedtime.   Yes Historical Provider, MD    ROS:  Out of a complete 14 system review of symptoms, the patient complains only of the following symptoms, and all other reviewed systems are negative.  Activity decreased, chills, fatigue Light sensitivity, blurred vision Cold intolerance, excessive thirst Sleep apnea, daytime sleepiness Food  allergies Dizziness, blackout, weakness Confusion, anxiety   Blood pressure 170/82, pulse 68, height 5\' 4"  (1.626 m), weight 135 lb 8 oz (61.462 kg).  Physical Exam  General: The patient is alert and cooperative at the time of the examination.  Skin: No significant peripheral edema is noted.   Neurologic Exam  Mental status: The patient is alert and oriented x 3 at the time of the examination. The patient has apparent normal recent and remote memory, with an apparently normal attention span and concentration ability.   Cranial nerves: Facial symmetry is present. Speech is normal, no aphasia or dysarthria is noted. Extraocular movements are full. Visual fields are full.  Motor: The patient has good strength in all 4 extremities.  Sensory examination: Soft touch sensation is symmetric on the face, arms, and legs.  Coordination: The patient has good finger-nose-finger and heel-to-shin bilaterally.  Gait and station: The patient has a normal gait. Tandem gait is slightly unsteady. Romberg is negative. No drift is seen.  Reflexes: Deep tendon reflexes are symmetric.   Assessment/Plan:   1. Recurrent syncopal events  2. Vertigo  3. Concussion  The patient is having some mild headaches, and confusion following the fall. The patient likely has sustained a concussion. The description of the most recent blackout event is not consistent with the seizure, the patient did have some stiffening in trembling of the arms, she could have had a "syncopal seizure", but she recovered mental status very rapidly. The patient is having some recurrence of vertigo, she is to have physical therapy for vestibular rehabilitation. The patient will be set up for a prolonged cardiac monitor study. She will follow-up in several months. The patient will contact me if she continues to have issues, she is not to operate a motor vehicle until further notice.   Autumn Parkins MD 05/30/2015 8:56 PM  [facility] [address] [place], [place] [address]  Phone [phone] Fax [phone]

## 2015-05-30 NOTE — Patient Instructions (Signed)
Syncope °Syncope is a medical term for fainting or passing out. This means you lose consciousness and drop to the ground. People are generally unconscious for less than 5 minutes. You may have some muscle twitches for up to 15 seconds before waking up and returning to normal. Syncope occurs more often in older adults, but it can happen to anyone. While most causes of syncope are not dangerous, syncope can be a sign of a serious medical problem. It is important to seek medical care.  °CAUSES  °Syncope is caused by a sudden drop in blood flow to the brain. The specific cause is often not determined. Factors that can bring on syncope include: °· Taking medicines that lower blood pressure. °· Sudden changes in posture, such as standing up quickly. °· Taking more medicine than prescribed. °· Standing in one place for too long. °· Seizure disorders. °· Dehydration and excessive exposure to heat. °· Low blood sugar (hypoglycemia). °· Straining to have a bowel movement. °· Heart disease, irregular heartbeat, or other circulatory problems. °· Fear, emotional distress, seeing blood, or severe pain. °SYMPTOMS  °Right before fainting, you may: °· Feel dizzy or light-headed. °· Feel nauseous. °· See all white or all black in your field of vision. °· Have cold, clammy skin. °DIAGNOSIS  °Your health care provider will ask about your symptoms, perform a physical exam, and perform an electrocardiogram (ECG) to record the electrical activity of your heart. Your health care provider may also perform other heart or blood tests to determine the cause of your syncope which may include: °· Transthoracic echocardiogram (TTE). During echocardiography, sound waves are used to evaluate how blood flows through your heart. °· Transesophageal echocardiogram (TEE). °· Cardiac monitoring. This allows your health care provider to monitor your heart rate and rhythm in real time. °· Holter monitor. This is a portable device that records your  heartbeat and can help diagnose heart arrhythmias. It allows your health care provider to track your heart activity for several days, if needed. °· Stress tests by exercise or by giving medicine that makes the heart beat faster. °TREATMENT  °In most cases, no treatment is needed. Depending on the cause of your syncope, your health care provider may recommend changing or stopping some of your medicines. °HOME CARE INSTRUCTIONS °· Have someone stay with you until you feel stable. °· Do not drive, use machinery, or play sports until your health care provider says it is okay. °· Keep all follow-up appointments as directed by your health care provider. °· Lie down right away if you start feeling like you might faint. Breathe deeply and steadily. Wait until all the symptoms have passed. °· Drink enough fluids to keep your urine clear or pale yellow. °· If you are taking blood pressure or heart medicine, get up slowly and take several minutes to sit and then stand. This can reduce dizziness. °SEEK IMMEDIATE MEDICAL CARE IF:  °· You have a severe headache. °· You have unusual pain in the chest, abdomen, or back. °· You are bleeding from your mouth or rectum, or you have black or tarry stool. °· You have an irregular or very fast heartbeat. °· You have pain with breathing. °· You have repeated fainting or seizure-like jerking during an episode. °· You faint when sitting or lying down. °· You have confusion. °· You have trouble walking. °· You have severe weakness. °· You have vision problems. °If you fainted, call your local emergency services (911 in [place] Do not drive   yourself to the hospital.  °MAKE SURE YOU: °· Understand these instructions. °· Will watch your condition. °· Will get help right away if you are not doing well or get worse. °Document Released: 09/28/2005 Document Revised: 10/03/2013 Document Reviewed: 11/27/2011 °Exit[employer]® Patient Information ©2015 [employer]. This information is not intended to replace  advice given to you by your health care provider. Make sure you discuss any questions you have with your health care provider. ° °

## 2015-06-03 ENCOUNTER — Telehealth: Payer: Self-pay | Admitting: Neurology

## 2015-06-03 NOTE — Telephone Encounter (Signed)
I called and spoke to the patient's husband. I explained that the [facility] would contact them to set up an appointment and that the referral was just sent on 8/18. They will call back if that have not heard anything in a few days.

## 2015-06-03 NOTE — Telephone Encounter (Signed)
Patient's daughter Jiles Garter is calling and states it was recommended at last appointment for her mother to have cardiac monitoring. They are asking for a call back to let them know where/when this is to be scheduled.  Thanks!

## 2015-06-04 ENCOUNTER — Telehealth: Payer: Self-pay | Admitting: Neurology

## 2015-06-04 NOTE — Telephone Encounter (Signed)
I called the husband, left a message. The telephone number to schedule the prolonged cardiac monitor was not attached to the order, the husband is upset that the ordering of the study was delayed because of this. I called to apologize concerning this. I called the home number and the cell number.

## 2015-06-04 NOTE — Telephone Encounter (Signed)
Pt's husband called and would like to speak with the nurse. He is upset about a referral that was sent out and misplace phone numbers. Please call back [phone] Cell number [phone] Daughter Raynald Blend

## 2015-06-04 NOTE — Telephone Encounter (Signed)
I called the patient's husband. The heart monitor order was placed correctly. I explained to the patient's husband how the referral process works for the heart monitors. He is still unhappy and would like to speak to Dr. Jannifer Franklin. I asked if the patient has an appointment scheduled now and he said it is for Thursday this week.

## 2015-06-06 ENCOUNTER — Ambulatory Visit (INDEPENDENT_AMBULATORY_CARE_PROVIDER_SITE_OTHER): Payer: Medicare Other | Admitting: Internal Medicine

## 2015-06-06 ENCOUNTER — Encounter: Payer: Self-pay | Admitting: Internal Medicine

## 2015-06-06 VITALS — BP 136/74 | HR 62 | Ht 64.0 in | Wt 140.0 lb

## 2015-06-06 DIAGNOSIS — R55 Syncope and collapse: Secondary | ICD-10-CM | POA: Diagnosis not present

## 2015-06-06 DIAGNOSIS — R42 Dizziness and giddiness: Secondary | ICD-10-CM

## 2015-06-06 NOTE — Patient Instructions (Signed)
Your Doctor has ordered you to wear a heart monitor. You will wear this for 30 days.   Your physician recommends that you schedule a follow-up appointment in: as needed - we will call you with the monitor results   TIPS -  REMINDERS 1. The sensor is the lanyard that is worn around your neck every day - this is powered by a battery that needs to be changed every day 2. The monitor is the device that allows you to record symptoms - this will need to be charged daily 3. The sensor & monitor need to be within 100 feet of each other at all times 4. The sensor connects to the electrodes (stickers) - these should be changed every 24-48 hours (you do not have to remove them when you bathe, just make sure they are dry when you connect it back to the sensor 5. If you need more supplies (electrodes, batteries), please call the 1-800 # on the back of the pamphlet and [employer] will mail you more supplies 6. If your skin becomes sensitive, please try the sample pack of sensitive skin electrodes (the white packet in your silver box) and call [employer] to have them mail you more of these type of electrodes 7. When you are finish wearing the monitor, please place all supplies back in the silver box, place the silver box in the pre-packaged UPS bag and drop off at [employer] or call them so they can come pick it up   Cardiac Event Monitoring A cardiac event monitor is a small recording device used to help detect abnormal heart rhythms (arrhythmias). The monitor is used to record heart rhythm when noticeable symptoms such as the following occur:  Fast heartbeats (palpitations), such as heart racing or fluttering.  Dizziness.  Fainting or light-headedness.  Unexplained weakness. The monitor is wired to two electrodes placed on your chest. Electrodes are flat, sticky disks that attach to your skin. The monitor can be worn for up to 30 days. You will wear the monitor at all times, except when bathing.  HOW TO USE YOUR  CARDIAC EVENT MONITOR A technician will prepare your chest for the electrode placement. The technician will show you how to place the electrodes, how to work the monitor, and how to replace the batteries. Take time to practice using the monitor before you leave the office. Make sure you understand how to send the information from the monitor to your health care provider. This requires a telephone with a landline, not a cell phone. You need to:  Wear your monitor at all times, except when you are in water:  Do not get the monitor wet.  Take the monitor off when bathing. Do not swim or use a hot tub with it on.  Keep your skin clean. Do not put body lotion or moisturizer on your chest.  Change the electrodes daily or any time they stop sticking to your skin. You might need to use tape to keep them on.  It is possible that your skin under the electrodes could become irritated. To keep this from happening, try to put the electrodes in slightly different places on your chest. However, they must remain in the area under your left breast and in the upper right section of your chest.  Make sure the monitor is safely clipped to your clothing or in a location close to your body that your health care provider recommends.  Press the button to record when you feel symptoms of heart  trouble, such as dizziness, weakness, light-headedness, palpitations, thumping, shortness of breath, unexplained weakness, or a fluttering or racing heart. The monitor is always on and records what happened slightly before you pressed the button, so do not worry about being too late to get good information.  Keep a diary of your activities, such as walking, doing chores, and taking medicine. It is especially important to note what you were doing when you pushed the button to record your symptoms. This will help your health care provider determine what might be contributing to your symptoms. The information stored in your monitor  will be reviewed by your health care provider alongside your diary entries.  Send the recorded information as recommended by your health care provider. It is important to understand that it will take some time for your health care provider to process the results.  Change the batteries as recommended by your health care provider. SEEK IMMEDIATE MEDICAL CARE IF:   You have chest pain.  You have extreme difficulty breathing or shortness of breath.  You develop a very fast heartbeat that persists.  You develop dizziness that does not go away.  You faint or constantly feel you are about to faint. Document Released: 07/07/2008 Document Revised: 02/12/2014 Document Reviewed: 03/27/2013 [facility] Patient Information 2015 [place], [place]. This information is not intended to replace advice given to you by your health care provider. Make sure you discuss any questions you have with your health care provider.

## 2015-06-06 NOTE — Progress Notes (Signed)
OFFICE NOTE  Chief Complaint:  Syncope  Primary Care Physician: Autumn Shad, MD  HPI:  Autumn Lawson is a pleasant 65 rolled female who is coming referred to me by Dr. Jannifer Lawson for evaluation of syncope. She has been hospitalized twice, [place] of a syncopal episode. At that time she underwent workup and it was felt to possibly be seizure. She was placed on Keppra for short period of time however had no refills of the medicine and was discontinued. During that fall she apparently suffered a skull fracture and had some intracranial bleeding. Subsequently she had an episode this past summer where she slumped over at the table without any warning. She was noted to have some posturing and was taken to the hospital. Evaluation included an extensive workup and there was a negative MRI, fairly normal echocardiogram and negative carotid Doppler with no other revealing findings. She's not felt to have had seizure and is not currently on medication for this. She also probably has a history of ADD and depression with anxiety. There is a history of PTSD which we did not explore further in the office today. She was referred for possible cardiac etiology of her symptoms. The only real possible cause for her sudden symptoms would be arrhythmia and possible Stokes-Adams events. She denies any chest pain or worsening shortness of breath. There is no cardiac history for her or her family members.  PMHx:  Past Medical History  Diagnosis Date  . ADD (attention deficit disorder with hyperactivity)   . Depression with anxiety   . Vertigo   . PTSD (post-traumatic stress disorder)   . Esophageal reflux   . Gallstone   . Skin cancer, basal cell   . Elevated LFTs   . Vertigo 03/2015  . Hematoma   . Memory deficits 04/22/2015  . Varicose vein of leg     bilateral    Past Surgical History  Procedure Laterality Date  . Varicose vein surgery    . Cesarean  section      x4  . Cataract extraction      bilateral  . Abdominal hysterectomy    . Back surgery      rod in back placed  . Liver surgery  1984  . Cholecystectomy  1981    FAMHx:  Family History  Problem Relation Age of Onset  . Leukemia Father   . Prostate cancer Other     paternal Autumn Lawson grandfather  . Alzheimer's disease Mother   . Heart failure Sister   . Healthy Brother   . Healthy Sister   . Healthy Sister   . Healthy Brother     SOCHx:   reports that she quit smoking about 39 years ago. She has never used smokeless tobacco. She reports that she drinks about 4.2 oz of alcohol per week. She reports that she does not use illicit drugs.  ALLERGIES:  Allergies  Allergen Reactions  . Levaquin [Levofloxacin In D5w] Other (See Comments)    Joint swelling    ROS: A comprehensive review of systems was negative except for: Cardiovascular: positive for syncope Neurological: positive for dizziness and memory problems Behavioral/Psych: positive for ADHD, anxiety, depression and PTSD  HOME MEDS: Current Outpatient Prescriptions  Medication Sig Dispense Refill  . AMBULATORY NON FORMULARY MEDICATION BioIdentical Hormones cream apply once a day     . calcium carbonate (TUMS - DOSED IN MG ELEMENTAL CALCIUM) 500 MG chewable tablet Chew 1 tablet by mouth  daily.    Marland Kitchen CALCIUM-MAG-VIT C-VIT D PO Take 6 capsules by mouth daily.      Marland Kitchen erythromycin ophthalmic ointment Place 1 application into the right eye daily.    Marland Kitchen PARoxetine (PAXIL) 10 MG tablet Take 5 mg by mouth every morning.     . Pomegranate, Punica granatum, (POMEGRANATE PO) Take 1 capsule by mouth daily as needed (supplement).    . psyllium (METAMUCIL SMOOTH TEXTURE) 28 % packet Take 1 packet by mouth at bedtime.     No current facility-administered medications for this visit.    LABS/IMAGING: No results found for this or any previous visit (from the past 48 hour(s)). No results found.  WEIGHTS: Wt Readings from  Last 3 Encounters:  06/06/15 140 lb (63.504 kg)  05/30/15 135 lb 8 oz (61.462 kg)  04/22/15 133 lb 6.4 oz (60.51 kg)    VITALS: BP 136/74 mmHg  Pulse 62  Ht 5\' 4"  (1.626 m)  Wt 140 lb (63.504 kg)  BMI 24.02 kg/m2  EXAM: General appearance: alert and no distress Neck: no carotid bruit, no JVD and There is ecchymosis and edema around the right orbit Lungs: clear to auscultation bilaterally Heart: regular rate and rhythm, S1, S2 normal, no murmur, click, rub or gallop Abdomen: soft, non-tender; bowel sounds normal; no masses,  no organomegaly Extremities: extremities normal, atraumatic, no cyanosis or edema Pulses: 2+ and symmetric Skin: Skin color, texture, turgor normal. No rashes or lesions Neurologic: Grossly normal Psych: Pleasant  EKG: Normal sinus rhythm at 62  ASSESSMENT: 1. Unexplained syncope  PLAN: 1.   [name] has had a number of unexplained syncopal events with head trauma and intracranial bleeding. The events were sudden and without warning which suggests either seizure or arrhythmic event. It is felt that seizure is less likely based on EEG monitoring. I agree that a 30 day monitor is indicated to rule out arrhythmia. Ultimately an implanted loop recorder may be necessary if the events are less frequent. There is a significant history of depression, anxiety, ADHD and PTSD, which raises the possibility that this could be an unusual presentation of a conversion disorder. If workup ultimately is negative, this should be considered as well.  Thanks for the kind referral. We'll contact him with results of her monitor and if an arrhythmia is noted further workup will be undertaken.  Autumn Casino, MD, [facility] Attending Cardiologist [place] 06/06/2015, 9:42 AM

## 2015-06-10 ENCOUNTER — Ambulatory Visit: Payer: Medicare Other | Admitting: Cardiovascular Disease

## 2015-06-20 ENCOUNTER — Telehealth: Payer: Self-pay | Admitting: Internal Medicine

## 2015-06-20 ENCOUNTER — Telehealth: Payer: Self-pay | Admitting: Neurology

## 2015-06-20 ENCOUNTER — Inpatient Hospital Stay (HOSPITAL_COMMUNITY)
Admission: EM | Admit: 2015-06-20 | Discharge: 2015-06-21 | DRG: 242 | Disposition: A | Discharge status: Nursing Home (Includes ICF) | Payer: Medicare Other | Attending: Internal Medicine | Admitting: Internal Medicine

## 2015-06-20 ENCOUNTER — Emergency Department (HOSPITAL_COMMUNITY): Payer: Medicare Other

## 2015-06-20 ENCOUNTER — Encounter (HOSPITAL_COMMUNITY): Payer: Self-pay | Admitting: Emergency Medicine

## 2015-06-20 ENCOUNTER — Encounter (HOSPITAL_COMMUNITY)
Admission: EM | Disposition: A | Discharge status: Nursing Home (Includes ICF) | Payer: Self-pay | Source: Home / Self Care | Attending: Internal Medicine

## 2015-06-20 DIAGNOSIS — I469 Cardiac arrest, cause unspecified: Secondary | ICD-10-CM

## 2015-06-20 DIAGNOSIS — S065X9A Traumatic subdural hemorrhage with loss of consciousness of unspecified duration, initial encounter: Secondary | ICD-10-CM | POA: Diagnosis present

## 2015-06-20 DIAGNOSIS — I495 Sick sinus syndrome: Principal | ICD-10-CM | POA: Diagnosis present

## 2015-06-20 DIAGNOSIS — R42 Dizziness and giddiness: Secondary | ICD-10-CM

## 2015-06-20 DIAGNOSIS — Z87891 Personal history of nicotine dependence: Secondary | ICD-10-CM | POA: Diagnosis not present

## 2015-06-20 DIAGNOSIS — Z950 Presence of cardiac pacemaker: Secondary | ICD-10-CM

## 2015-06-20 DIAGNOSIS — R55 Syncope and collapse: Secondary | ICD-10-CM | POA: Diagnosis present

## 2015-06-20 DIAGNOSIS — R001 Bradycardia, unspecified: Secondary | ICD-10-CM | POA: Diagnosis present

## 2015-06-20 DIAGNOSIS — Z85828 Personal history of other malignant neoplasm of skin: Secondary | ICD-10-CM | POA: Diagnosis not present

## 2015-06-20 DIAGNOSIS — Z87820 Personal history of traumatic brain injury: Secondary | ICD-10-CM

## 2015-06-20 DIAGNOSIS — S065XAA Traumatic subdural hemorrhage with loss of consciousness status unknown, initial encounter: Secondary | ICD-10-CM | POA: Diagnosis present

## 2015-06-20 HISTORY — PX: EP IMPLANTABLE DEVICE: SHX172B

## 2015-06-20 HISTORY — DX: Cardiac arrest, cause unspecified: I46.9

## 2015-06-20 LAB — URINALYSIS, ROUTINE W REFLEX MICROSCOPIC
BILIRUBIN URINE: NEGATIVE
Glucose, UA: NEGATIVE mg/dL
Hgb urine dipstick: NEGATIVE
KETONES UR: NEGATIVE mg/dL
Leukocytes, UA: NEGATIVE
NITRITE: NEGATIVE
Protein, ur: NEGATIVE mg/dL
Specific Gravity, Urine: 1.005 (ref 1.005–1.030)
UROBILINOGEN UA: 0.2 mg/dL (ref 0.0–1.0)
pH: 7 (ref 5.0–8.0)

## 2015-06-20 LAB — I-STAT CHEM 8, ED
BUN: 16 mg/dL (ref 6–20)
CHLORIDE: 103 mmol/L (ref 101–111)
Calcium, Ion: 1.17 mmol/L (ref 1.13–1.30)
Creatinine, Ser: 0.7 mg/dL (ref 0.44–1.00)
Glucose, Bld: 96 mg/dL (ref 65–99)
HEMATOCRIT: 44 % (ref 36.0–46.0)
Hemoglobin: 15 g/dL (ref 12.0–15.0)
POTASSIUM: 4 mmol/L (ref 3.5–5.1)
SODIUM: 141 mmol/L (ref 135–145)
TCO2: 27 mmol/L (ref 0–100)

## 2015-06-20 LAB — CBC WITH DIFFERENTIAL/PLATELET
BASOS ABS: 0 10*3/uL (ref 0.0–0.1)
BASOS PCT: 0 % (ref 0–1)
EOS ABS: 0.1 10*3/uL (ref 0.0–0.7)
EOS PCT: 1 % (ref 0–5)
HCT: 43.6 % (ref 36.0–46.0)
Hemoglobin: 14.5 g/dL (ref 12.0–15.0)
LYMPHS PCT: 22 % (ref 12–46)
Lymphs Abs: 1.5 10*3/uL (ref 0.7–4.0)
MCH: 32.8 pg (ref 26.0–34.0)
MCHC: 33.3 g/dL (ref 30.0–36.0)
MCV: 98.6 fL (ref 78.0–100.0)
Monocytes Absolute: 0.5 10*3/uL (ref 0.1–1.0)
Monocytes Relative: 8 % (ref 3–12)
Neutro Abs: 4.8 10*3/uL (ref 1.7–7.7)
Neutrophils Relative %: 69 % (ref 43–77)
Platelets: 206 10*3/uL (ref 150–400)
RBC: 4.42 MIL/uL (ref 3.87–5.11)
RDW: 12.5 % (ref 11.5–15.5)
WBC: 6.9 10*3/uL (ref 4.0–10.5)

## 2015-06-20 LAB — I-STAT TROPONIN, ED: Troponin i, poc: 0 ng/mL (ref 0.00–0.08)

## 2015-06-20 LAB — TROPONIN I: Troponin I: 0.05 ng/mL — ABNORMAL HIGH (ref <0.031)

## 2015-06-20 SURGERY — PACEMAKER IMPLANT
Anesthesia: LOCAL

## 2015-06-20 MED ORDER — MIDAZOLAM HCL 5 MG/5ML IJ SOLN
INTRAMUSCULAR | Status: DC | PRN
  Administered 2015-06-20 (×2): 1 mg via INTRAVENOUS

## 2015-06-20 MED ORDER — FENTANYL CITRATE (PF) 100 MCG/2ML IJ SOLN
INTRAMUSCULAR | Status: DC | PRN
  Administered 2015-06-20: 25 ug via INTRAVENOUS

## 2015-06-20 MED ORDER — HEPARIN (PORCINE) IN NACL 2-0.9 UNIT/ML-% IJ SOLN
INTRAMUSCULAR | Status: DC | PRN
  Administered 2015-06-20: 17:00:00

## 2015-06-20 MED ORDER — CHLORHEXIDINE GLUCONATE 4 % EX LIQD
60.0000 mL | Freq: Once | CUTANEOUS | Status: DC
  Filled 2015-06-20: qty 60

## 2015-06-20 MED ORDER — SODIUM CHLORIDE 0.9 % IV SOLN: 250.0000 mL | INTRAVENOUS | Status: DC

## 2015-06-20 MED ORDER — SODIUM CHLORIDE 0.9 % IV SOLN
INTRAVENOUS | Status: DC
  Administered 2015-06-20: 250 mL via INTRAVENOUS

## 2015-06-20 MED ORDER — POLYETHYLENE GLYCOL 3350 17 G PO PACK
17.0000 g | PACK | Freq: Every day | ORAL | Status: DC
  Administered 2015-06-21: 17 g via ORAL
  Filled 2015-06-20: qty 1

## 2015-06-20 MED ORDER — LIDOCAINE HCL (PF) 1 % IJ SOLN
INTRAMUSCULAR | Status: AC
  Filled 2015-06-20: qty 60

## 2015-06-20 MED ORDER — CEFAZOLIN SODIUM 1-5 GM-% IV SOLN
1.0000 g | Freq: Four times a day (QID) | INTRAVENOUS | Status: AC
  Administered 2015-06-20 – 2015-06-21 (×3): 1 g via INTRAVENOUS
  Filled 2015-06-20 (×3): qty 50

## 2015-06-20 MED ORDER — SODIUM CHLORIDE 0.9 % IJ SOLN: 3.0000 mL | Freq: Two times a day (BID) | INTRAMUSCULAR | Status: DC

## 2015-06-20 MED ORDER — ONDANSETRON HCL 4 MG/2ML IJ SOLN: 4.0000 mg | Freq: Four times a day (QID) | INTRAMUSCULAR | Status: DC | PRN

## 2015-06-20 MED ORDER — GENTAMICIN SULFATE 40 MG/ML IJ SOLN
80.0000 mg | INTRAMUSCULAR | Status: AC
  Administered 2015-06-20: 80 mg
  Filled 2015-06-20: qty 2

## 2015-06-20 MED ORDER — ASPIRIN EC 81 MG PO TBEC
81.0000 mg | DELAYED_RELEASE_TABLET | Freq: Every day | ORAL | Status: DC
  Administered 2015-06-21: 09:00:00 81 mg via ORAL
  Filled 2015-06-20: qty 1

## 2015-06-20 MED ORDER — SODIUM CHLORIDE 0.9 % IR SOLN
Status: AC
  Filled 2015-06-20: qty 2

## 2015-06-20 MED ORDER — MIDAZOLAM HCL 5 MG/5ML IJ SOLN
INTRAMUSCULAR | Status: AC
  Filled 2015-06-20: qty 25

## 2015-06-20 MED ORDER — NEOMYCIN-POLYMYXIN-DEXAMETH 3.5-10000-0.1 OP OINT
1.0000 "application " | TOPICAL_OINTMENT | Freq: Three times a day (TID) | OPHTHALMIC | Status: DC
  Administered 2015-06-20 – 2015-06-21 (×2): 1 via OPHTHALMIC
  Filled 2015-06-20: qty 3.5

## 2015-06-20 MED ORDER — YOU HAVE A PACEMAKER BOOK
Freq: Once | Status: AC
  Administered 2015-06-20: 20:00:00
  Filled 2015-06-20: qty 1

## 2015-06-20 MED ORDER — LIDOCAINE HCL (PF) 1 % IJ SOLN
INTRAMUSCULAR | Status: DC | PRN
  Administered 2015-06-20: 40 mL via INTRADERMAL

## 2015-06-20 MED ORDER — CEFAZOLIN SODIUM-DEXTROSE 2-3 GM-% IV SOLR
2.0000 g | INTRAVENOUS | Status: AC
  Administered 2015-06-20: 2 g via INTRAVENOUS
  Filled 2015-06-20: qty 50

## 2015-06-20 MED ORDER — CEFAZOLIN SODIUM-DEXTROSE 2-3 GM-% IV SOLR
INTRAVENOUS | Status: AC
  Filled 2015-06-20: qty 50

## 2015-06-20 MED ORDER — ACETAMINOPHEN 325 MG PO TABS
325.0000 mg | ORAL_TABLET | ORAL | Status: DC | PRN
  Administered 2015-06-20 – 2015-06-21 (×2): 650 mg via ORAL
  Filled 2015-06-20: qty 2

## 2015-06-20 MED ORDER — HYDRALAZINE HCL 20 MG/ML IJ SOLN
INTRAMUSCULAR | Status: AC
  Filled 2015-06-20: qty 1

## 2015-06-20 MED ORDER — ONDANSETRON HCL 4 MG/2ML IJ SOLN
4.0000 mg | Freq: Four times a day (QID) | INTRAMUSCULAR | Status: DC | PRN
  Administered 2015-06-20: 4 mg via INTRAVENOUS
  Filled 2015-06-20: qty 2

## 2015-06-20 MED ORDER — FENTANYL CITRATE (PF) 100 MCG/2ML IJ SOLN
INTRAMUSCULAR | Status: AC
  Filled 2015-06-20: qty 4

## 2015-06-20 MED ORDER — ONDANSETRON HCL 4 MG PO TABS: 4.0000 mg | ORAL_TABLET | Freq: Four times a day (QID) | ORAL | Status: DC | PRN

## 2015-06-20 MED ORDER — ACETAMINOPHEN 325 MG PO TABS
ORAL_TABLET | ORAL | Status: AC
  Filled 2015-06-20: qty 2

## 2015-06-20 MED ORDER — HYDRALAZINE HCL 20 MG/ML IJ SOLN
10.0000 mg | Freq: Once | INTRAMUSCULAR | Status: AC
  Administered 2015-06-20: 10 mg via INTRAVENOUS

## 2015-06-20 MED ORDER — SODIUM CHLORIDE 0.9 % IV SOLN: INTRAVENOUS | Status: DC

## 2015-06-20 MED ORDER — PAROXETINE HCL 10 MG PO TABS
5.0000 mg | ORAL_TABLET | ORAL | Status: DC
  Administered 2015-06-21: 5 mg via ORAL
  Filled 2015-06-20 (×2): qty 0.5

## 2015-06-20 MED ORDER — MECLIZINE HCL 12.5 MG PO TABS
12.5000 mg | ORAL_TABLET | Freq: Three times a day (TID) | ORAL | Status: DC | PRN
  Filled 2015-06-20: qty 1

## 2015-06-20 MED ORDER — SODIUM CHLORIDE 0.9 % IJ SOLN: 3.0000 mL | INTRAMUSCULAR | Status: DC | PRN

## 2015-06-20 SURGICAL SUPPLY — 8 items
CABLE SURGICAL S-101-97-12 (CABLE) ×2 IMPLANT
LEAD CAPSURE NOVUS 45CM (Lead) ×2 IMPLANT
LEAD CAPSURE NOVUS 5076-52CM (Lead) ×2 IMPLANT
PACEMAKER ADAPTA DR ADDRL1 (Pacemaker) ×1 IMPLANT
PAD DEFIB LIFELINK (PAD) ×2 IMPLANT
PPM ADAPTA DR ADDRL1 (Pacemaker) ×2 IMPLANT
SHEATH CLASSIC 7F (SHEATH) ×4 IMPLANT
TRAY PACEMAKER INSERTION (CUSTOM PROCEDURE TRAY) ×2 IMPLANT

## 2015-06-20 NOTE — Telephone Encounter (Signed)
Pt's daughter called and says her mother was sent to [facility] ER and she just wanted to let her Dr. Gwyndolyn Kaufman. The facility she stays at says it looked like a seizure. Please call daughter back

## 2015-06-20 NOTE — H&P (Signed)
Autumn Lawson is an 74 y.o. female.    Primary Cardiologist: Dr. Debara Lawson  EHM:CNOBSJGG,EZMOQH Autumn Docker, MD  Chief Complaint: syncope  HPI: 74 year old female recently seen by Dr. Debara Lawson for syncope, referred to [place] by neuro.  She had had 2 episodes one in DEC at that time thought to be seizure and placed on Keppra- but stopped.  Another time with fall with skull fracture and intracranial bleeding.  This past summer she had episode where she slumped over without warning.  She had normal echo and negative carotid.  Not felt to have seizures.     Her events were sudden and without warning, Dr. Debara Lawson arranged 30 day event monitor.    Today around 1130 am pt was sitting in a chair and her eyes rolled back and she had some shaking of arms and nor responsive.  She states she felt weird like an out of body expierence.   On event monitor at 1126 she had 10 sec of asystole with junct rhythm once HR back.   On no meds to cause bradycardia   Now in ER without complaints.  SR no chest pain, no SOB.  Has not eaten since BK.     EKG SR at 61 borderline Rt axis deviation. No acute changes.  Troponin 0.00, lytes normal. CT of her head No acute intracranial abnormality. Stable atrophy and chronic white matter disease.  Past Medical History  Diagnosis Date  . ADD (attention deficit disorder with hyperactivity)   . Depression with anxiety   . Vertigo   . PTSD (post-traumatic stress disorder)   . Esophageal reflux   . Gallstone   . Skin cancer, basal cell   . Elevated LFTs   . Vertigo 03/2015  . Hematoma   . Memory deficits 04/22/2015  . Varicose vein of leg     bilateral  . Asystole, 10 sec 06/20/2015    Past Surgical History  Procedure Laterality Date  . Varicose vein surgery    . Cesarean section      x4  . Cataract extraction      bilateral  . Abdominal hysterectomy    . Back surgery      rod in back placed  . Liver surgery  1984  . Cholecystectomy  1981    Family History    Problem Relation Age of Onset  . Leukemia Father   . Prostate cancer Other     paternal [place] grandfather  . Alzheimer's disease Mother   . Heart failure Sister   . Healthy Brother   . Healthy Sister   . Healthy Sister   . Healthy Brother    Social History:  reports that she quit smoking about 39 years ago. She has never used smokeless tobacco. She reports that she drinks about 4.2 oz of alcohol per week. She reports that she does not use illicit drugs.  Allergies:  Allergies  Allergen Reactions  . Levaquin [Levofloxacin In D5w] Other (See Comments)    Joint swelling    OUTPATIENT MEDICATIONS: No current facility-administered medications on file prior to encounter.   Current Outpatient Prescriptions on File Prior to Encounter  Medication Sig Dispense Refill  . PARoxetine (PAXIL) 10 MG tablet Take 5 mg by mouth every morning.        Results for orders placed or performed during the hospital encounter of 06/20/15 (from the past 48 hour(s))  CBC with Differential/Platelet     Status: None  Collection Time: 06/20/15  2:04 PM  Result Value Ref Range   WBC 6.9 4.0 - 10.5 K/uL   RBC 4.42 3.87 - 5.11 MIL/uL   Hemoglobin 14.5 12.0 - 15.0 g/dL   HCT 43.6 36.0 - 46.0 %   MCV 98.6 78.0 - 100.0 fL   MCH 32.8 26.0 - 34.0 pg   MCHC 33.3 30.0 - 36.0 g/dL   RDW 12.5 11.5 - 15.5 %   Platelets 206 150 - 400 K/uL   Neutrophils Relative % 69 43 - 77 %   Neutro Abs 4.8 1.7 - 7.7 K/uL   Lymphocytes Relative 22 12 - 46 %   Lymphs Abs 1.5 0.7 - 4.0 K/uL   Monocytes Relative 8 3 - 12 %   Monocytes Absolute 0.5 0.1 - 1.0 K/uL   Eosinophils Relative 1 0 - 5 %   Eosinophils Absolute 0.1 0.0 - 0.7 K/uL   Basophils Relative 0 0 - 1 %   Basophils Absolute 0.0 0.0 - 0.1 K/uL  I-stat troponin, ED     Status: None   Collection Time: 06/20/15  2:10 PM  Result Value Ref Range   Troponin i, poc 0.00 0.00 - 0.08 ng/mL   Comment 3            Comment: Due to the release kinetics of cTnI, a  negative result within the first hours of the onset of symptoms does not rule out myocardial infarction with certainty. If myocardial infarction is still suspected, repeat the test at appropriate intervals.   I-stat chem 8, ed     Status: None   Collection Time: 06/20/15  2:12 PM  Result Value Ref Range   Sodium 141 135 - 145 mmol/L   Potassium 4.0 3.5 - 5.1 mmol/L   Chloride 103 101 - 111 mmol/L   BUN 16 6 - 20 mg/dL   Creatinine, Ser 0.70 0.44 - 1.00 mg/dL   Glucose, Bld 96 65 - 99 mg/dL   Calcium, Ion 1.17 1.13 - 1.30 mmol/L   TCO2 27 0 - 100 mmol/L   Hemoglobin 15.0 12.0 - 15.0 g/dL   HCT 44.0 36.0 - 46.0 %  Urinalysis, Routine w reflex microscopic (not at [facility])     Status: None   Collection Time: 06/20/15  3:11 PM  Result Value Ref Range   Color, Urine YELLOW YELLOW   APPearance CLEAR CLEAR   Specific Gravity, Urine 1.005 1.005 - 1.030   pH 7.0 5.0 - 8.0   Glucose, UA NEGATIVE NEGATIVE mg/dL   Hgb urine dipstick NEGATIVE NEGATIVE   Bilirubin Urine NEGATIVE NEGATIVE   Ketones, ur NEGATIVE NEGATIVE mg/dL   Protein, ur NEGATIVE NEGATIVE mg/dL   Urobilinogen, UA 0.2 0.0 - 1.0 mg/dL   Nitrite NEGATIVE NEGATIVE   Leukocytes, UA NEGATIVE NEGATIVE    Comment: MICROSCOPIC NOT DONE ON URINES WITH NEGATIVE PROTEIN, BLOOD, LEUKOCYTES, NITRITE, OR GLUCOSE <1000 mg/dL.   Ct Head Wo Contrast  06/20/2015   CLINICAL DATA:  Loss of consciousness  EXAM: CT HEAD WITHOUT CONTRAST  TECHNIQUE: Contiguous axial images were obtained from the base of the skull through the vertex without intravenous contrast.  COMPARISON:  03/22/2015  FINDINGS: No skull fracture is noted. Paranasal sinuses and mastoid air cells are unremarkable. No intracranial hemorrhage, mass effect or midline shift. Stable atrophy and chronic white matter disease. No acute cortical infarction. No mass lesion is noted on this unenhanced scan.  IMPRESSION: No acute intracranial abnormality. Stable atrophy and chronic white matter  disease.   Electronically Signed   By: Lahoma Crocker M.D.   On: 06/20/2015 14:36    GYJ:EHUDJSH:FW colds or fevers, no weight changes Skin:no rashes or ulcers HEENT:no blurred vision, no congestion, + sty on her eye CV:see HPI PUL:see HPI GI:no diarrhea constipation or melena, no indigestion GU:no hematuria, no dysuria MS:no joint pain, no claudication Neuro:+ syncope, + lightheadedness hx vertigo take meclazine prn Endo:no diabetes, no thyroid disease    Blood pressure 158/81, pulse 61, temperature 98 F (36.7 C), temperature source Oral, resp. rate 15, height 5\' 4"  (1.626 m), weight 140 lb (63.504 kg), SpO2 100 %. PE: General:Pleasant affect, NAD Skin:Warm and dry, brisk capillary refill HEENT:normocephalic, sclera clear, mucus membranes moist, rt eye with stye on lid Neck:supple, no JVD, no bruits  Heart:S1S2 RRR without murmur, gallup, rub or click Lungs:clear without rales, rhonchi, or wheezes YOV:ZCHY, non tender, + BS, do not palpate liver spleen or masses Ext:no lower ext edema, 2+ pedal pulses, 2+ radial pulses Neuro:alert and oriented, MAE, follows commands, + facial symmetry    Assessment/Plan Principal Problem:   Asystole, 10 sec Active Problems:   Syncope and collapse   Vertigo   Subdural hemorrhage following injury, hx   Dr. Lovena Le has seen -plan for PPM for symptomatic asystole.  Will be admitted overnight.   [place] Nurse Practitioner Certified [place] Pager [phone] or after 5pm or weekends call [phone] 06/20/2015, 4:22 PM  EP Attending  Patient seen and examined. Agree with findings as noted by Cecilie Kicks, NP. The patient has symptomatic sinus node dysfunction with long 10 second pauses. She is on no meds to slow her down. I have reviewed the indications, risks/benefits/goals/expectations of PM insertion and she wishes to proceed.   [name].D.

## 2015-06-20 NOTE — ED Notes (Signed)
Pt sent from [facility] where she has been doing rehab for approx 1 month after having frequent falls pt was placed on a cardiac monitor. Around 1130 bystanders state pt was sitting in a chair and pts eyes rolled back and started having some shaking in her arms and was not responding for a few minutes. Ems obtained cardiac monitor reading and appears pt had 10 second pauses and was sinus brady. Pt states she remembers feeling, "very nauseous and remembers her arms started to shake, and then i felt like i woke back up."  Denies any chest pain, SOB. Denies falling or hitting head. Pt is alert and ox4. Warm and dry.

## 2015-06-20 NOTE — Telephone Encounter (Signed)
Events noted, the patient is being admitted for a pacemaker insertion, the patient had a 10 second episode of asystole around the time of the syncopal event.

## 2015-06-20 NOTE — ED Notes (Signed)
Admitting at bedside 

## 2015-06-20 NOTE — Telephone Encounter (Signed)
Richardson Landry from [facility] called ans said they were with patient at her residence.  It was reported to them that patient had a seizure with loss of consciousness in the time period of 1115am to 1145am.    Noted [employer] Monitor on patient.  They would like to know if they could get a monitor strip from that time period  [employer] has just notified me of a 10 second ventricular pause at 1127am.  Received Faxed Monitored strip from [facility] for Richardson Landry to report 10 second pause   [facility] on way to [employer].  Will call me with [facility] fax when they arrive  [facility] faxed to [phone]

## 2015-06-20 NOTE — Interval H&P Note (Signed)
History and Physical Interval Note:  06/20/2015 4:42 PM  Autumn Lawson  has presented today for surgery, with the diagnosis of asystole/syncope  The various methods of treatment have been discussed with the patient and family. After consideration of risks, benefits and other options for treatment, the patient has consented to  Procedure(s): Pacemaker Implant (N/A) as a surgical intervention .  The patient's history has been reviewed, patient examined, no change in status, stable for surgery.  I have reviewed the patient's chart and labs.  Questions were answered to the patient's satisfaction.     Cristopher Peru

## 2015-06-20 NOTE — ED Provider Notes (Signed)
CSN: [id number]     Arrival date & time 06/20/15  1336 History   First MD Initiated Contact with Patient 06/20/15 1338     No chief complaint on file.    (Consider location/radiation/quality/duration/timing/severity/associated sxs/prior Treatment) HPI   74 year old female with significant history of depression, anxiety, ADHD, PTSD presents for evaluation of seizure-like activity. Patient report earlier today she was playing bingo when she felt queasy, and blanked out for a short period and spontaneously recovered. She did report that both arms were shaking during that episode. She did not fall off chair and denies any specific injury. She denies having any severe headache, neck pain, chest pain, difficulty breathing, lightheadedness, dizziness, abdominal pain, nausea vomiting diarrhea, dysuria, focal numbness or weakness. She denies any recent medication changes.  Pt had several syncopal episodes last month, causing head trauma with intracranial bleeding and having seizure like activities afterward.  Pt was placed on Keppra but EEG shows no obvious signs to suggest seizure. She's currently on cardiac monitor to r/o arrhythmia.  Per EMS note, a cardiac monitor reading was obtained and it appears that patient has a 10 second pause and was sinus bradycardia prior to arrival.     Past Medical History  Diagnosis Date  . ADD (attention deficit disorder with hyperactivity)   . Depression with anxiety   . Vertigo   . PTSD (post-traumatic stress disorder)   . Esophageal reflux   . Gallstone   . Skin cancer, basal cell   . Elevated LFTs   . Vertigo 03/2015  . Hematoma   . Memory deficits 04/22/2015  . Varicose vein of leg     bilateral   Past Surgical History  Procedure Laterality Date  . Varicose vein surgery    . Cesarean section      x4  . Cataract extraction      bilateral  . Abdominal hysterectomy    . Back surgery      rod in back placed  . Liver surgery  1984  . Cholecystectomy   1981   Family History  Problem Relation Age of Onset  . Leukemia Father   . Prostate cancer Other     paternal [place] grandfather  . Alzheimer's disease Mother   . Heart failure Sister   . Healthy Brother   . Healthy Sister   . Healthy Sister   . Healthy Brother    Social History  Substance Use Topics  . Smoking status: Former Smoker    Quit date: 05/14/1976  . Smokeless tobacco: Never Used  . Alcohol Use: 4.2 oz/week    0 Standard drinks or equivalent, 7 Glasses of wine per week     Comment: wine with dinner   OB History    No data available     Review of Systems  All other systems reviewed and are negative.     Allergies  Levaquin  Home Medications   Prior to Admission medications   Medication Sig Start Date End Date Taking? Authorizing Provider  AMBULATORY NON FORMULARY MEDICATION BioIdentical Hormones cream apply once a day     Historical Provider, MD  calcium carbonate (TUMS - DOSED IN MG ELEMENTAL CALCIUM) 500 MG chewable tablet Chew 1 tablet by mouth daily.    Historical Provider, MD  CALCIUM-MAG-VIT C-VIT D PO Take 6 capsules by mouth daily.      Historical Provider, MD  erythromycin ophthalmic ointment Place 1 application into the right eye daily. 06/04/15   Historical Provider, MD  PARoxetine (  PAXIL) 10 MG tablet Take 5 mg by mouth every morning.     Historical Provider, MD  Pomegranate, [place] granatum, (POMEGRANATE PO) Take 1 capsule by mouth daily as needed (supplement).    Historical Provider, MD  psyllium (METAMUCIL SMOOTH TEXTURE) 28 % packet Take 1 packet by mouth at bedtime.    Historical Provider, MD   There were no vitals taken for this visit. Physical Exam  Constitutional: She is oriented to person, place, and time. She appears well-developed and well-nourished. No distress.  HENT:  Head: Atraumatic.  Eyes: Conjunctivae are normal.  Neck: Neck supple.  No nuchal rigidity  Cardiovascular: Normal rate and regular rhythm.   Pulmonary/Chest:  Effort normal and breath sounds normal.  Abdominal: Soft. There is no tenderness.  Neurological: She is alert and oriented to person, place, and time. She has normal strength. No cranial nerve deficit or sensory deficit. She displays a negative Romberg sign. GCS eye subscore is 4. GCS verbal subscore is 5. GCS motor subscore is 6.  Skin: No rash noted.  Psychiatric: She has a normal mood and affect.  Nursing note and vitals reviewed.   ED Course  Procedures (including critical care time)  2:59 PM Patient here with episode of near syncope while playing bingo. Symptom was not brought on due to positional change. No specific injury were noted. Patient is back to baseline. She is currently on a heart monitor by cardiologist for prior recurrent syncopal episodes. Per EMS, patient appears to have a 10 second pause and was sinus bradycardia initially. This finding is likely the contributor to her recurrent syncope.  Plan to consult cardiology for further management.  Care discussed with Dr. Betsey Holiday.    3:18 PM Appreciate consultation from [place] who will have a cardiologist to see pt in ER and will admit for further management.  Pt is aware of plan and agree with plan.    Labs Review Labs Reviewed  CBC WITH DIFFERENTIAL/PLATELET  URINALYSIS, ROUTINE W REFLEX MICROSCOPIC (NOT AT [facility])  I-STAT CHEM 8, ED  I-STAT TROPOININ, ED  CBG MONITORING, ED    Imaging Review Ct Head Wo Contrast  06/20/2015   CLINICAL DATA:  Loss of consciousness  EXAM: CT HEAD WITHOUT CONTRAST  TECHNIQUE: Contiguous axial images were obtained from the base of the skull through the vertex without intravenous contrast.  COMPARISON:  03/22/2015  FINDINGS: No skull fracture is noted. Paranasal sinuses and mastoid air cells are unremarkable. No intracranial hemorrhage, mass effect or midline shift. Stable atrophy and chronic white matter disease. No acute cortical infarction. No mass lesion is noted on this unenhanced scan.   IMPRESSION: No acute intracranial abnormality. Stable atrophy and chronic white matter disease.   Electronically Signed   By: Lahoma Crocker M.D.   On: 06/20/2015 14:36   I have personally reviewed and evaluated these images and lab results as part of my medical decision-making.   EKG Interpretation   Date/Time:  Thursday June 20 2015 13:44:44 EDT Ventricular Rate:  61 PR Interval:  158 QRS Duration: 97 QT Interval:  438 QTC Calculation: 441 R Axis:   81 Text Interpretation:  Sinus rhythm Borderline right axis deviation No  significant change since last tracing Confirmed by POLLINA  MD,  [place] [phone]) on 06/20/2015 1:53:16 PM      MDM   Final diagnoses:  Syncope, cardiogenic    BP 158/81 mmHg  Pulse 61  Temp(Src) 98 F (36.7 C) (Oral)  Resp 15  Ht 5\' 4"  (1.626  m)  Wt 140 lb (63.504 kg)  BMI 24.02 kg/m2  SpO2 100%  I have reviewed nursing notes and vital signs. I personally viewed the imaging tests through PACS system and agrees with radiologist's intepretation I reviewed available ER/hospitalization records through the EMR     Domenic Moras, PA-C 06/20/15 [place], MD 06/20/15 1535

## 2015-06-21 ENCOUNTER — Inpatient Hospital Stay (HOSPITAL_COMMUNITY): Payer: Medicare Other

## 2015-06-21 ENCOUNTER — Encounter (HOSPITAL_COMMUNITY): Payer: Self-pay | Admitting: Internal Medicine

## 2015-06-21 DIAGNOSIS — I495 Sick sinus syndrome: Principal | ICD-10-CM

## 2015-06-21 LAB — COMPREHENSIVE METABOLIC PANEL
ALBUMIN: 3.5 g/dL (ref 3.5–5.0)
ALK PHOS: 101 U/L (ref 38–126)
ALT: 40 U/L (ref 14–54)
ANION GAP: 10 (ref 5–15)
AST: 38 U/L (ref 15–41)
BILIRUBIN TOTAL: 0.7 mg/dL (ref 0.3–1.2)
BUN: 11 mg/dL (ref 6–20)
CALCIUM: 9.2 mg/dL (ref 8.9–10.3)
CO2: 24 mmol/L (ref 22–32)
Chloride: 106 mmol/L (ref 101–111)
Creatinine, Ser: 0.73 mg/dL (ref 0.44–1.00)
GLUCOSE: 142 mg/dL — AB (ref 65–99)
POTASSIUM: 3.9 mmol/L (ref 3.5–5.1)
Sodium: 140 mmol/L (ref 135–145)
TOTAL PROTEIN: 6.3 g/dL — AB (ref 6.5–8.1)

## 2015-06-21 LAB — CBC
HEMATOCRIT: 41.8 % (ref 36.0–46.0)
HEMOGLOBIN: 13.6 g/dL (ref 12.0–15.0)
MCH: 32.1 pg (ref 26.0–34.0)
MCHC: 32.5 g/dL (ref 30.0–36.0)
MCV: 98.6 fL (ref 78.0–100.0)
Platelets: 203 10*3/uL (ref 150–400)
RBC: 4.24 MIL/uL (ref 3.87–5.11)
RDW: 12.6 % (ref 11.5–15.5)
WBC: 7 10*3/uL (ref 4.0–10.5)

## 2015-06-21 LAB — TROPONIN I
TROPONIN I: 0.03 ng/mL (ref <0.031)
Troponin I: 0.03 ng/mL (ref <0.031)

## 2015-06-21 NOTE — Clinical Social Work Note (Signed)
Clinical Social Work Assessment  Patient Details  Name: Autumn Lawson MRN: [id number] Date of Birth: 07-Feb-1941  Date of referral:  06/21/15               Reason for consult:  Facility Placement                Permission sought to share information with:  Other (Patient and daughter in the room) Permission granted to share information::  Yes, Verbal Permission Granted  Name::        Agency::     Relationship::  Daughter  Contact Information:     Housing/Transportation Living arrangements for the past 2 months:  [place] of Information:  Adult Children Patient Interpreter Needed:  None Criminal Activity/Legal Involvement Pertinent to Current Situation/Hospitalization:  No - Comment as needed Significant Relationships:  Adult Children Lives with:  Facility Resident ([facility]) Do you feel safe going back to the place where you live?  Yes Need for family participation in patient care:  Yes (Comment)  Care giving concerns:  None expressed by patient or family   Social Worker assessment / plan:  CSW informed by nurse that patient ready for discharge back to assisted living. CSW talked with patient and daughter at the bedside and confirmed that patient from [facility] and the plan is to return. Daughter will transport patient back to ALF. Call made to facility and discharge information transmitted to nurse Dottie for review.  Employment status:  Retired Health visitor, Managed Care PT Recommendations:  No Follow Up Information / Referral to community resources:  Other (Comment Required) (None needed or requested at this time)  Patient/Family's Response to care:  No concerns expressed regarding care at the hospital.  Patient/Family's Understanding of and Emotional Response to Diagnosis, Current Treatment, and Prognosis:  Not discussed.  Emotional Assessment Appearance:   Appears stated  age. Attitude/Demeanor/Rapport:  Other (Appropriate) Affect (typically observed):  Appropriate Orientation:  Oriented to Self, Oriented to Place, Oriented to  Time, Oriented to Situation Alcohol / Substance use:  Tobacco Use, Alcohol Use (Patient quit smoking about 39 years ago. She does drink, but does not use illicit drugs.) Psych involvement (Current and /or in the community):  No (Comment)  Discharge Needs  Concerns to be addressed:  No discharge needs identified Readmission within the last 30 days:  No Current discharge risk:  None Barriers to Discharge:  No Barriers Identified   Sable Feil, LCSW 06/21/2015, 12:23 PM

## 2015-06-21 NOTE — Discharge Summary (Signed)
Physician Discharge Summary  Patient ID: Autumn Lawson MRN: [id number] DOB/AGE: 76-26-1942 76 y.o.  Primary Cardiologist: Dr. Debara Pickett Electrophysiologist: Dr. Lovena Le    Admit date: 06/20/2015 Discharge date: 06/21/2015  Admission Diagnoses: Asytole, 10 sec  Discharge Diagnoses:  Principal Problem:   Asystole, 10 sec Active Problems:   Vertigo   Subdural hemorrhage following injury, hx    Syncope and collapse   Sinus node dysfunction   Syncope   Discharged Condition: stable  Hospital Course: 74 y/o female, with no significant past cardiac history, who was admitted for syncope and 10 sec asystole detected by holter monitor. She was on no AVN blocking agents. No reversible causes were identified. Subsequently, she underwent insertion of a PPM by Dr. Lovena Le on 06/20/15. She tolerated the procedure well and left the OR in stable condition. She was monitored overnight and had no complications. Post-operative CXR showed proper lead placement and no evidence of pneumothorax. Device interrogation revealed normal functioning. She denied CP and dyspnea. Her incision site remained stable. She had no arrhthymias on telemetry. She was last seen and examined by Dr. Rayann Heman who determined she was stable for discharge back to assisted living. A 10 day wound check f/u was arranged as well as 3 month f/u with Dr. Lovena Le.   Consults: None  Significant Diagnostic Studies:  Post op CXR 06/21/15 EXAM: CHEST 2 VIEW  COMPARISON: PA and lateral chest x-ray of January 02, 2010  FINDINGS: The lungs are hyperinflated with hemidiaphragm flattening. There is no pneumothorax or pleural effusion. The interstitial markings are mildly increased though stable. The heart and pulmonary vascularity are normal. The permanent pacemaker is in reasonable position. The mediastinum is normal in width. There is tortuosity of the descending thoracic aorta. No acute bony abnormality is demonstrated.  IMPRESSION: COPD.  There is no acute postprocedure complication.   Treatments:  PPM insertion (see op note for details)  Discharge Exam: Blood pressure 112/55, pulse 74, temperature 97.6 F (36.4 C), temperature source Oral, resp. rate 15, height 5\' 4"  (1.626 m), weight 136 lb 11.2 oz (62.007 kg), SpO2 97 %. Physical Exam  Constitutional: She appears well-developed and well-nourished. No distress.  HENT:  Head: Normocephalic and atraumatic.  Cardiovascular: Normal rate and regular rhythm.   Pulmonary/Chest: Effort normal. No respiratory distress.  Musculoskeletal: She exhibits no edema.  Skin: Skin is warm and dry.  Psychiatric: She has a normal mood and affect. Her behavior is normal.    Disposition: 01-Home or Self Care      Discharge Instructions    Diet - low sodium heart healthy    Complete by:  As directed      Increase activity slowly    Complete by:  As directed             Medication List    TAKE these medications        meclizine 12.5 MG tablet  Commonly known as:  ANTIVERT  Take 12.5 mg by mouth every 8 (eight) hours as needed for dizziness.     neomycin-polymyxin b-dexamethasone 3.5-10000-0.1 Oint  Commonly known as:  MAXITROL  Place 1 application into the right eye 3 (three) times daily.     PARoxetine 10 MG tablet  Commonly known as:  PAXIL  Take 5 mg by mouth every morning.     polyethylene glycol packet  Commonly known as:  MIRALAX / GLYCOLAX  Take 17 g by mouth daily.       Follow-up Information    Follow up with  [facility] On 07/01/2015.   Why:  2:00 pm , For wound re-check   Contact information:   [address] [place] [address]       Follow up with Cristopher Peru, MD On 09/18/2015.   Specialty:  Cardiology   Why:  10:00 am    Contact information:   [address] [place] [place] [address] [phone]      TIME SPENT ON DISCHARGE, INCLUDING PHYSICIAN TIME: >30 MINUTES  Signed: Lyda Jester 06/21/2015, 11:50 AM  Thompson Grayer MD

## 2015-06-21 NOTE — Clinical Social Work Note (Signed)
Patient medically stable to discharge back to [facility]. Clinical information transmitted to facility for nursing director's review. Dottie Radio broadcast assistant) will review information and contact CSW. Mrs. Mcelmurry will be transported back to ALF by her daughter.  Jermale Crass Givens, MSW, LCSW Licensed Clinical Social Worker [place] [phone]

## 2015-06-21 NOTE — Progress Notes (Signed)
Doing well s/p PPM  CXR and device interrogation were reviewed and normal  Thompson Grayer MD, [facility] 06/21/2015 11:22 AM

## 2015-06-21 NOTE — Progress Notes (Signed)
UR COMPLETED  

## 2015-06-21 NOTE — Discharge Instructions (Signed)
° ° °  Supplemental Discharge Instructions for  Pacemaker/Defibrillator Patients  Activity No heavy lifting or vigorous activity with your left/right arm for 6 to 8 weeks.  Do not raise your left/right arm above your head for one week.  Gradually raise your affected arm as drawn below.          06/22/15                         06/24/15                  06/26/15                  06/28/15 __  NO DRIVING until your wound check follow-up visit  [place] the wound area clean and dry.  Do not get this area wet for one week. No showers for one week; you may shower on  06/28/15   . - The tape/steri-strips on your wound will fall off; do not pull them off.  No bandage is needed on the site.  DO  NOT apply any creams, oils, or ointments to the wound area. - If you notice any drainage or discharge from the wound, any swelling or bruising at the site, or you develop a fever > 101? F after you are discharged home, call the office at once.  Special Instructions - You are still able to use cellular telephones; use the ear opposite the side where you have your pacemaker/defibrillator.  Avoid carrying your cellular phone near your device. - When traveling through airports, show security personnel your identification card to avoid being screened in the metal detectors.  Ask the security personnel to use the hand wand. - Avoid arc welding equipment, MRI testing (magnetic resonance imaging), TENS units (transcutaneous nerve stimulators).  Call the office for questions about other devices. - Avoid electrical appliances that are in poor condition or are not properly grounded. - Microwave ovens are safe to be near or to operate.  Additional information for defibrillator patients should your device go off: - If your device goes off ONCE and you feel fine afterward, notify the device clinic nurses. - If your device goes off ONCE and you do not feel well afterward, call 911. - If your device goes off TWICE, call  911. - If your device goes off THREE times in one day, call 911.  DO NOT DRIVE YOURSELF OR A FAMILY MEMBER WITH A DEFIBRILLATOR TO THE HOSPITAL--CALL 911.

## 2015-06-26 ENCOUNTER — Telehealth: Payer: Self-pay | Admitting: Internal Medicine

## 2015-06-26 NOTE — Telephone Encounter (Signed)
Do not see report for event monitor  Routing to device clinic to see if they can answer the son's question as to how many times the patients heard stopped while wearing the monitor.

## 2015-06-26 NOTE — Telephone Encounter (Signed)
Pt was wearing a monitor that she received from this office on 06-06-15. She went to the hospital and admitted  On 06-20-15. She had a pacemaker put in. The problem now is,the company wants their monitor. She had the monitor on when she went to the hospital. His primary concern is that he wants to know how many times the pt heart stopped  while wearing the monitor. Dr Lovena Le told both of his daughters they would get a report from the monitor, they still have not received it. Where can they get this report?

## 2015-06-27 ENCOUNTER — Telehealth: Payer: Self-pay | Admitting: Internal Medicine

## 2015-06-27 NOTE — Telephone Encounter (Signed)
Dianely Krehbiel (patient's husband) called asking to speak with a nurse regarding his wife's event monitor results. The patient's family was told they would be provided a full copy of the monitor results the day after her 06/20/15 surgery (pacemaker placement) and they are still waiting for someone to follow up and provide such. He emphasized that they do not just want to hear the MDs results   Additionally, Mr. Acoff said the monitor company ([employer]) called the patient yesterday asking the patient to return the monitor. This concerns the patient because she said when she was taken to [facility] they removed the monitor and did not send it home with them after discharge. Mr. Nicholl said "the hospital seems to have lost her monitor and we are being held responsible for it's whereabout".   The patient is scheduled for a f/u wound/pacer check appointment with our device clinic on Monday, 07/01/15 at 2pm. I told Mr. Kuyper we would have documentation printed and available to give to the patient at this appointment.  Brita Romp, Clinical Supervisor at the [facility] office, logged into [place] to help me investigate this - she said there are weekly and daily reports in the system that we can provide for the patient however the Final Report is not available yet. Curt Bears faxed the available weekly and daily reports to [facility] so that we can give these to the patient at her appointment on Monday. Curt Bears also emailed the [employer] rep to inquire about the status of the Final Report and let them know that the patient does not have the monitor (the patient is saying the hospital has the monitor).  After consulting with medical records department and obtaining the correct medical records release form the faxed monitor results that [facility] sent and the release form were both given to [facility] in the device clinic for [facility] appointment.   I am routing this phone note to Brita Romp and  Presho Truit so that NL can add any f/u notes from [employer] response.

## 2015-07-01 ENCOUNTER — Ambulatory Visit (INDEPENDENT_AMBULATORY_CARE_PROVIDER_SITE_OTHER): Payer: Medicare Other | Admitting: *Deleted

## 2015-07-01 DIAGNOSIS — I469 Cardiac arrest, cause unspecified: Secondary | ICD-10-CM | POA: Diagnosis not present

## 2015-07-01 DIAGNOSIS — I495 Sick sinus syndrome: Secondary | ICD-10-CM | POA: Diagnosis not present

## 2015-07-01 NOTE — Telephone Encounter (Signed)
I have been in contact with Jenny Reichmann ([employer] rep) in regards to Mrs Mcweeney's monitor.  He will be getting in touch with the hospital to find out what needs to be done in regards to retrieving the monitor.

## 2015-07-03 LAB — CUP PACEART INCLINIC DEVICE CHECK
Battery Impedance: 100 Ohm
Battery Voltage: 2.79 V
Brady Statistic AP VS Percent: 8 %
Brady Statistic AS VS Percent: 92 %
Date Time Interrogation Session: 20160919171455
Lead Channel Impedance Value: 608 Ohm
Lead Channel Impedance Value: 656 Ohm
Lead Channel Pacing Threshold Pulse Width: 0.4 ms
Lead Channel Sensing Intrinsic Amplitude: 4 mV
Lead Channel Setting Pacing Amplitude: 3.5 V
Lead Channel Setting Pacing Amplitude: 3.5 V
Lead Channel Setting Sensing Sensitivity: 2.8 mV
MDC IDC MSMT BATTERY REMAINING LONGEVITY: 167 mo
MDC IDC MSMT LEADCHNL RA PACING THRESHOLD AMPLITUDE: 0.5 V
MDC IDC MSMT LEADCHNL RA PACING THRESHOLD PULSEWIDTH: 0.4 ms
MDC IDC MSMT LEADCHNL RV PACING THRESHOLD AMPLITUDE: 0.75 V
MDC IDC MSMT LEADCHNL RV SENSING INTR AMPL: 5.6 mV
MDC IDC SET LEADCHNL RV PACING PULSEWIDTH: 0.4 ms
MDC IDC STAT BRADY AP VP PERCENT: 0 %
MDC IDC STAT BRADY AS VP PERCENT: 0 %

## 2015-07-03 NOTE — Telephone Encounter (Signed)
Monitor applied and being processed throught the [facility] office.  Monitor company used was [employer], not available at the [employer]. [employer].

## 2015-07-03 NOTE — Progress Notes (Signed)
Wound check appointment. Steri-strips removed. Wound without redness or edema. Incision edges approximated, wound well healed. Normal device function. Thresholds, sensing, and impedances consistent with implant measurements. Device programmed at 3.5V for extra safety margin until 3 month visit. Histogram distribution appropriate for patient and level of activity. (2) mode switches (<0.1%)---both <30 sec, no EGMs. No high ventricular rates noted. Patient educated about wound care, arm mobility, lifting restrictions. ROV in 3 months with GT.

## 2015-07-04 ENCOUNTER — Ambulatory Visit (INDEPENDENT_AMBULATORY_CARE_PROVIDER_SITE_OTHER): Payer: Medicare Other

## 2015-07-04 DIAGNOSIS — R55 Syncope and collapse: Secondary | ICD-10-CM | POA: Diagnosis not present

## 2015-07-04 DIAGNOSIS — R42 Dizziness and giddiness: Secondary | ICD-10-CM | POA: Diagnosis not present

## 2015-07-10 ENCOUNTER — Other Ambulatory Visit: Payer: Self-pay

## 2015-07-10 DIAGNOSIS — Z1231 Encounter for screening mammogram for malignant neoplasm of breast: Secondary | ICD-10-CM

## 2015-07-11 ENCOUNTER — Telehealth: Payer: Self-pay | Admitting: Internal Medicine

## 2015-07-11 NOTE — Telephone Encounter (Signed)
New message     Pt has a pacemaker.  She fell this am.  Calling to see if anyone can look at her strip and see if anything happened around 8:30am or was the fall due to her vertigo?

## 2015-07-11 NOTE — Telephone Encounter (Signed)
Spoke w/ pt daughter and informed her that pt would need to send a manual transmission from home monitor. Pt has a carelink smart and pt daughter is not aware of pt having this monitor. Pt daughter stated that pt would be unable to use app or my carelink smart. Pt daughter is going to figure out who has home monitor and app and have them send transmission.

## 2015-07-11 NOTE — Telephone Encounter (Signed)
Spoke w/ pt husband and informed him that a device tech looked at the transmission and stated that there was no episodes and lead function was normal, and that pt was probably dizzy b/c of vertigo. Pt husband verbalized understanding.

## 2015-07-15 ENCOUNTER — Ambulatory Visit
Admission: RE | Admit: 2015-07-15 | Discharge: 2015-07-15 | Disposition: A | Discharge status: Home / Self Care | Payer: Medicare Other | Source: Ambulatory Visit

## 2015-07-15 DIAGNOSIS — Z1231 Encounter for screening mammogram for malignant neoplasm of breast: Secondary | ICD-10-CM

## 2015-07-18 ENCOUNTER — Other Ambulatory Visit: Payer: Self-pay | Admitting: Obstetrics and Gynecology

## 2015-07-18 DIAGNOSIS — R928 Other abnormal and inconclusive findings on diagnostic imaging of breast: Secondary | ICD-10-CM

## 2015-07-22 ENCOUNTER — Telehealth: Payer: Self-pay | Admitting: Internal Medicine

## 2015-07-22 ENCOUNTER — Telehealth: Payer: Self-pay | Admitting: *Deleted

## 2015-07-22 NOTE — Telephone Encounter (Signed)
Pacemaker remote received 10/9 & 10/10. Both reports show all functions & diagnostics normal. Pt's spouse was concered pt's heart rate was too slow. I asked how he defined "too slow." He said his daughter told him <70bpm is too slow while sitting. I stated a range of 60-100bpm is normal; 60bpm while sitting is perfectly normal. Spouse inquired if she should see a regular cardiologist. He states she does not have any chest pains. I advised a PCP would be the better option due to normal pacemaker transmission and no chest pains.

## 2015-07-22 NOTE — Telephone Encounter (Signed)
Husband called back and requested me to call pt directly. I called pt. She describes her symptoms as a wave of nausea with a pit in her stomach followed by syncope. Today, she feels a weight on her chest with exhaustion. Pt states she has hx of vertigo but is reluctant to take her meclizine. States she doesn't like to take meds unless absolutely must. She will take meclizine as instructed if/when an episode reoccurs. I assured pt, based on pacemaker remotes, there is no concern from a pulse/rhythm standpoint. If meclizine does not work, she will take further action including contacting her PCP.   Pt reiterated her pulse is too low. I asked what she defines as too low, she stated in the 60s but below 70bpm. I reassured her that is perfectly normal. We would not reprogram her lower rate to 70bpm (currently programmed at 60bpm). Her daughter thought a rate <70bpm means concern of bradycardia. I explained 60-100bpm is normal. If rate was in 40s, for example, I would be concerned about syncope.

## 2015-07-22 NOTE — Telephone Encounter (Signed)
New Message  4. Are you calling to see if we received your device transmission? No.  Pt husband called states that the pt's Heart slowed down and all of a sudden it returned back to the normal beat. Requests a call back to discuss If you can check the monitor.

## 2015-07-23 ENCOUNTER — Telehealth: Payer: Self-pay | Admitting: *Deleted

## 2015-07-23 NOTE — Telephone Encounter (Signed)
Pt's spouse called again today stating pt had a presyncopal episode while eating. With assistance from her two daughters, she went to a sofa. Pulse was 80bpm. Spouse stated she has hx of vertigo (see phone note from yesterday). I explained to pt her device will allow varying pulse range with a rate no lower than 60bpm. He also wanted explanation about how much info is catalogued. I updated him all quantified info between in-person interrogations will remain in the device. He stated he was thankful to know that function. They opted not to send a remote at this time.

## 2015-07-26 NOTE — Telephone Encounter (Signed)
Spoke with our [employer] rep. Monitor has been returned.

## 2015-07-29 ENCOUNTER — Other Ambulatory Visit: Payer: PRIVATE HEALTH INSURANCE

## 2015-07-30 ENCOUNTER — Encounter: Payer: Self-pay | Admitting: Internal Medicine

## 2015-08-09 ENCOUNTER — Telehealth: Payer: Self-pay | Admitting: Internal Medicine

## 2015-08-09 NOTE — Telephone Encounter (Signed)
New Message    Pt calling wanting to know if it is ok for her to have a regular dental cleaning with her pacemaker or if there is anything special that needs to be done. Please call back.

## 2015-08-09 NOTE — Telephone Encounter (Signed)
Nothing special needed to clean teeth.  I have left a message for her on her machine with this information

## 2015-09-18 ENCOUNTER — Encounter: Payer: Self-pay | Admitting: Internal Medicine

## 2015-09-18 ENCOUNTER — Ambulatory Visit (INDEPENDENT_AMBULATORY_CARE_PROVIDER_SITE_OTHER): Payer: Medicare Other | Admitting: Internal Medicine

## 2015-09-18 VITALS — BP 130/78 | HR 73 | Ht 64.0 in | Wt 148.6 lb

## 2015-09-18 DIAGNOSIS — Z950 Presence of cardiac pacemaker: Secondary | ICD-10-CM | POA: Diagnosis not present

## 2015-09-18 DIAGNOSIS — I495 Sick sinus syndrome: Secondary | ICD-10-CM

## 2015-09-18 DIAGNOSIS — R55 Syncope and collapse: Secondary | ICD-10-CM

## 2015-09-18 LAB — CUP PACEART INCLINIC DEVICE CHECK
Battery Impedance: 100 Ohm
Brady Statistic AP VP Percent: 0 %
Brady Statistic AP VS Percent: 27 %
Implantable Lead Implant Date: 20160908
Implantable Lead Location: 753859
Implantable Lead Location: 753860
Implantable Lead Model: 5076
Lead Channel Impedance Value: 562 Ohm
Lead Channel Pacing Threshold Amplitude: 0.5 V
Lead Channel Pacing Threshold Amplitude: 0.5 V
Lead Channel Pacing Threshold Pulse Width: 0.4 ms
Lead Channel Pacing Threshold Pulse Width: 0.4 ms
Lead Channel Pacing Threshold Pulse Width: 0.4 ms
Lead Channel Sensing Intrinsic Amplitude: 2.8 mV
MDC IDC LEAD IMPLANT DT: 20160908
MDC IDC MSMT BATTERY REMAINING LONGEVITY: 175 mo
MDC IDC MSMT BATTERY VOLTAGE: 2.79 V
MDC IDC MSMT LEADCHNL RA PACING THRESHOLD AMPLITUDE: 0.375 V
MDC IDC MSMT LEADCHNL RA PACING THRESHOLD PULSEWIDTH: 0.4 ms
MDC IDC MSMT LEADCHNL RV IMPEDANCE VALUE: 662 Ohm
MDC IDC MSMT LEADCHNL RV PACING THRESHOLD AMPLITUDE: 0.625 V
MDC IDC MSMT LEADCHNL RV SENSING INTR AMPL: 5.6 mV
MDC IDC SESS DTM: 20161207144254
MDC IDC SET LEADCHNL RA PACING AMPLITUDE: 1.5 V
MDC IDC SET LEADCHNL RV PACING AMPLITUDE: 2 V
MDC IDC SET LEADCHNL RV PACING PULSEWIDTH: 0.4 ms
MDC IDC SET LEADCHNL RV SENSING SENSITIVITY: 2.8 mV
MDC IDC STAT BRADY AS VP PERCENT: 0 %
MDC IDC STAT BRADY AS VS PERCENT: 73 %

## 2015-09-18 NOTE — Assessment & Plan Note (Signed)
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 Will recheck in several months. 

## 2015-09-18 NOTE — Assessment & Plan Note (Signed)
She has evidence of autonomic dysfunction. She is encouraged to maintain a high sodium diet.

## 2015-09-18 NOTE — Patient Instructions (Addendum)
Medication Instructions:  Your physician recommends that you continue on your current medications as directed. Please refer to the Current Medication list given to you today.  Labwork: None ordered  Testing/Procedures: None ordered  Follow-Up: Remote monitoring is used to monitor your Pacemaker of ICD from home. This monitoring reduces the number of office visits required to check your device to one time per year. It allows us to keep an eye on the functioning of your device to ensure it is working properly. You are scheduled for a device check from home on 12/18/15. You may send your transmission at any time that day. If you have a wireless device, the transmission will be sent automatically. After your physician reviews your transmission, you will receive a postcard with your next transmission date.  Your physician wants you to follow-up in: 9 months with Dr. Taylor.  You will receive a reminder letter in the mail two months in advance. If you don't receive a letter, please call our office to schedule the follow-up appointment.  If you need a refill on your cardiac medications before your next appointment, please call your pharmacy.  Thank you for choosing [facility]!!         

## 2015-09-18 NOTE — Assessment & Plan Note (Signed)
She is stable after PPM insertion. Will follow.

## 2015-09-18 NOTE — Progress Notes (Signed)
HPI Autumn Lawson returns today for followup. She is a pleasant 74 yo woman with a h/o symptomatic bradycardia,s /p PPM insertion. She has had a couple of episodes since her PPM was inserted where it felt like she might pass out. She laid down and the symptoms resolved. No other complaints. She has not started back exercising. Allergies  Allergen Reactions  . Levaquin [Levofloxacin In D5w] Other (See Comments)    Joint swelling     Current Outpatient Prescriptions  Medication Sig Dispense Refill  . meclizine (ANTIVERT) 12.5 MG tablet Take 12.5 mg by mouth every 8 (eight) hours as needed for dizziness.    Marland Kitchen PARoxetine (PAXIL) 10 MG tablet Take 5 mg by mouth every morning.     . polyethylene glycol (MIRALAX / GLYCOLAX) packet Take 17 g by mouth daily.     No current facility-administered medications for this visit.     Past Medical History  Diagnosis Date  . ADD (attention deficit disorder with hyperactivity)   . Depression with anxiety   . Vertigo   . PTSD (post-traumatic stress disorder)   . Esophageal reflux   . Gallstone   . Skin cancer, basal cell   . Elevated LFTs   . Vertigo 03/2015  . Hematoma   . Memory deficits 04/22/2015  . Varicose vein of leg     bilateral  . Asystole, 10 sec 06/20/2015    ROS:   All systems reviewed and negative except as noted in the HPI.   Past Surgical History  Procedure Laterality Date  . Varicose vein surgery    . Cesarean section      x4  . Cataract extraction      bilateral  . Abdominal hysterectomy    . Back surgery      rod in back placed  . Liver surgery  1984  . Cholecystectomy  1981  . Ep implantable device N/A 06/20/2015    Procedure: Pacemaker Implant;  Surgeon: Evans Lance, MD;  Location: [place] [facility];  Service: Cardiovascular;  Laterality: N/A;     Family History  Problem Relation Age of Onset  . Leukemia Father   . Prostate cancer Other     paternal [place] grandfather  . Alzheimer's disease Mother    . Heart failure Sister   . Healthy Brother   . Healthy Sister   . Healthy Sister   . Healthy Brother      Social History   Social History  . Marital Status: Married    Spouse Name: N/A  . Number of Children: 4  . Years of Education: N/A   Occupational History  . retired Therapist, sports    Social History Main Topics  . Smoking status: Former Smoker    Quit date: 05/14/1976  . Smokeless tobacco: Never Used  . Alcohol Use: 4.2 oz/week    0 Standard drinks or equivalent, 7 Glasses of wine per week     Comment: wine with dinner  . Drug Use: No  . Sexual Activity: Not on file   Other Topics Concern  . Not on file   Social History Narrative   Patient drinks 3 cups of caffeine daily.   Patient is right handed.     BP 130/78 mmHg  Pulse 73  Ht 5\' 4"  (1.626 m)  Wt 148 lb 9.6 oz (67.405 kg)  BMI 25.49 kg/m2  Physical Exam:  Well appearing 74 yo woman, NAD HEENT: Unremarkable Neck:  No JVD, no  thyromegally Lymphatics:  No adenopathy Back:  No CVA tenderness Lungs:  Clear with no wheezes HEART:  Regular rate rhythm, no murmurs, no rubs, no clicks Abd:  soft, positive bowel sounds, no organomegally, no rebound, no guarding Ext:  2 plus pulses, no edema, no cyanosis, no clubbing Skin:  No rashes no nodules Neuro:  CN II through XII intact, motor grossly intact  EKG - nsr  DEVICE  Normal device function.  See PaceArt for details.   Assess/Plan:

## 2015-09-23 ENCOUNTER — Telehealth: Payer: Self-pay | Admitting: Neurology

## 2015-09-23 NOTE — Telephone Encounter (Signed)
Beth is not listed on patient's DPR.

## 2015-09-23 NOTE — Telephone Encounter (Signed)
Patient's daughter Eustaquio Maize is calling as she feels there is some medical history that you need before seeing her mother at 10:15 this morning.  Please call.  Thanks!

## 2015-09-23 NOTE — Telephone Encounter (Signed)
I called the patient's husband. They were outside of our building. He stated that the card given to him for this appointment states "09/23/15." I advised her appointment is for 10/24/15. I offered to work the patient in with [employer] at 11:30. They declined and stated they would just come back in January. I asked the husband about what Eustaquio Maize might have been calling about and he stated he would tell us during the appointment. I explained that I cannot call her due to her name not being listed on the patient's DPR form.

## 2015-10-02 ENCOUNTER — Telehealth: Payer: Self-pay

## 2015-10-02 NOTE — Telephone Encounter (Signed)
I had a message to call the patient's husband. I called the patient's husband. He wanted to give Dr. Jannifer Franklin an update on his wife before their appointment tomorrow. The patient is apparently doing much worse with her memory, especially over the past couple of weeks. She asks her husband the same questions over and over again. The patient's husband states that about 3-4 years ago the patient had some testing done that showed she had "mild cognitive impairment." The patient did not agree with this diagnosis and tried to ignore it. He is not sure of the doctor's name who did the testing or if we have the report. He believes it was done in [place]. The patient's husband feels like there is probably some truth to it though and would like to know if she could try medication for memory. He states she would be very resistant to this but if Dr. Jannifer Franklin brings it up in the office visit and encourages her to try it, she might would consider it. I advised that I would let Dr. Jannifer Franklin know this.

## 2015-10-03 ENCOUNTER — Ambulatory Visit (INDEPENDENT_AMBULATORY_CARE_PROVIDER_SITE_OTHER): Payer: Medicare Other | Admitting: Neurology

## 2015-10-03 ENCOUNTER — Encounter (INDEPENDENT_AMBULATORY_CARE_PROVIDER_SITE_OTHER): Payer: Self-pay

## 2015-10-03 ENCOUNTER — Encounter: Payer: Self-pay | Admitting: Neurology

## 2015-10-03 VITALS — BP 141/86 | HR 82 | Ht 64.0 in | Wt 147.0 lb

## 2015-10-03 DIAGNOSIS — R413 Other amnesia: Secondary | ICD-10-CM

## 2015-10-03 DIAGNOSIS — R2681 Unsteadiness on feet: Secondary | ICD-10-CM | POA: Diagnosis not present

## 2015-10-03 DIAGNOSIS — R55 Syncope and collapse: Secondary | ICD-10-CM

## 2015-10-03 MED ORDER — DONEPEZIL HCL 5 MG PO TABS: 5.0000 mg | ORAL_TABLET | Freq: Every day | ORAL | Status: DC

## 2015-10-03 NOTE — Patient Instructions (Addendum)

## 2015-10-03 NOTE — Progress Notes (Signed)
Reason for visit: Memory disturbance  Autumn Lawson is an 74 y.o. female  History of present illness:  Autumn Lawson is a 74 year old right-handed white female with a history of a progressive memory disorder. The patient's husband has noted a significant change in her memory even over the last week or 2. She is having increasing problems with short-term memory, she cannot keep track of her schedule. She indicates that she is able to manage her own medications and appointments, she states that she has always had some problems with concentration, and ADD problems. She takes notes, she has had difficulty remembering names for people, but this has been a lifelong problem. She recently has had some issues with syncope, was found to have asystole, requiring a pacemaker. The patient is not operating a motor vehicle currently. She returns to this office for an evaluation.  Past Medical History  Diagnosis Date  . ADD (attention deficit disorder with hyperactivity)   . Depression with anxiety   . Vertigo   . PTSD (post-traumatic stress disorder)   . Esophageal reflux   . Gallstone   . Skin cancer, basal cell   . Elevated LFTs   . Vertigo 03/2015  . Hematoma   . Memory deficits 04/22/2015  . Varicose vein of leg     bilateral  . Asystole, 10 sec 06/20/2015    Past Surgical History  Procedure Laterality Date  . Varicose vein surgery    . Cesarean section      x4  . Cataract extraction      bilateral  . Abdominal hysterectomy    . Back surgery      rod in back placed  . Liver surgery  1984  . Cholecystectomy  1981  . Ep implantable device N/A 06/20/2015    Procedure: Pacemaker Implant;  Surgeon: Evans Lance, MD;  Location: [place] [facility];  Service: Cardiovascular;  Laterality: N/A;    Family History  Problem Relation Age of Onset  . Leukemia Father   . Prostate cancer Other     paternal [place] grandfather  . Alzheimer's disease Mother   . Heart failure Sister   . Healthy  Brother   . Healthy Sister   . Healthy Sister   . Healthy Brother     Social history:  reports that she quit smoking about 39 years ago. She has never used smokeless tobacco. She reports that she drinks about 4.2 oz of alcohol per week. She reports that she does not use illicit drugs.    Allergies  Allergen Reactions  . Levaquin [Levofloxacin In D5w] Other (See Comments)    Joint swelling    Medications:  Prior to Admission medications   Medication Sig Start Date End Date Taking? Authorizing Provider  meclizine (ANTIVERT) 12.5 MG tablet Take 12.5 mg by mouth every 8 (eight) hours as needed for dizziness.   Yes Historical Provider, MD  PARoxetine (PAXIL) 10 MG tablet Take 5 mg by mouth every morning.    Yes Historical Provider, MD  polyethylene glycol (MIRALAX / GLYCOLAX) packet Take 17 g by mouth daily.   Yes Historical Provider, MD  donepezil (ARICEPT) 5 MG tablet Take 1 tablet (5 mg total) by mouth at bedtime. 10/03/15   Kathrynn Ducking, MD    ROS:  Out of a complete 14 system review of symptoms, the patient complains only of the following symptoms, and all other reviewed systems are negative.  Weight change Varicose veins Light sensitivity Cold intolerance Sleep  apnea bruising easily Anxiety   Blood pressure 141/86, pulse 82, height 5\' 4"  (1.626 m), weight 147 lb (66.679 kg).  Physical Exam  General: The patient is alert and cooperative at the time of the examination.  Skin: No significant peripheral edema is noted.   Neurologic Exam  Mental status: The patient is alert and oriented x 2 at the time of the examination (not oriented to date). The Mini-Mental Status Examination done today shows a total score of 25/30. The patient is able to name 11 animals in 30 seconds..   Cranial nerves: Facial symmetry is present. Speech is normal, no aphasia or dysarthria is noted. Extraocular movements are full. Visual fields are full.  Motor: The patient has good strength in  all 4 extremities.  Sensory examination: Soft touch sensation is symmetric on the face, arms, and legs.  Coordination: The patient has good finger-nose-finger and heel-to-shin bilaterally.  Gait and station: The patient has a normal gait. Tandem gait is normal. Romberg is negative. No drift is seen.  Reflexes: Deep tendon reflexes are symmetric.   Assessment/Plan:  1. Cardiogenic syncope, pacemaker insertion   2. Mild memory disturbance    The patient herself has had difficulty understanding that she does have a memory issue. She has agreed to give a trial on Aricept, we will call in a prescription for the 5 mg tablet. She will follow-up in 6 months, we will continue to follow the memory problems. The husband believes that there has been a significant change in memory over the last several weeks.   Autumn Alexanders MD 10/04/2015 9:23 AM  [facility] [address] [place], [place] [address]  Phone [phone] Fax [phone]

## 2015-10-24 ENCOUNTER — Ambulatory Visit: Payer: Medicare Other | Admitting: Neurology

## 2015-12-09 ENCOUNTER — Other Ambulatory Visit: Payer: Self-pay | Admitting: Neurology

## 2015-12-09 MED ORDER — DONEPEZIL HCL 5 MG PO TABS: ORAL_TABLET | ORAL | Status: DC

## 2015-12-09 NOTE — Addendum Note (Signed)
Addended by: Margette Fast on: 12/09/2015 02:02 PM   Modules accepted: Orders

## 2015-12-09 NOTE — Telephone Encounter (Signed)
aricept was refilled

## 2015-12-18 ENCOUNTER — Ambulatory Visit (INDEPENDENT_AMBULATORY_CARE_PROVIDER_SITE_OTHER): Payer: Medicare Other | Admitting: *Deleted

## 2015-12-18 DIAGNOSIS — I495 Sick sinus syndrome: Secondary | ICD-10-CM

## 2015-12-20 NOTE — Progress Notes (Signed)
Remote pacemaker transmission.   

## 2015-12-21 LAB — CUP PACEART REMOTE DEVICE CHECK
Battery Impedance: 100 Ohm
Battery Remaining Longevity: 173 mo
Battery Voltage: 2.79 V
Brady Statistic AP VS Percent: 42 %
Brady Statistic AS VP Percent: 0 %
Date Time Interrogation Session: 20170308212604
Implantable Lead Implant Date: 20160908
Implantable Lead Location: 753859
Implantable Lead Model: 5076
Lead Channel Impedance Value: 599 Ohm
Lead Channel Setting Pacing Pulse Width: 0.4 ms
MDC IDC LEAD IMPLANT DT: 20160908
MDC IDC LEAD LOCATION: 753860
MDC IDC MSMT LEADCHNL RV IMPEDANCE VALUE: 598 Ohm
MDC IDC SET LEADCHNL RA PACING AMPLITUDE: 1.5 V
MDC IDC SET LEADCHNL RV PACING AMPLITUDE: 2 V
MDC IDC SET LEADCHNL RV SENSING SENSITIVITY: 2.8 mV
MDC IDC STAT BRADY AP VP PERCENT: 0 %
MDC IDC STAT BRADY AS VS PERCENT: 57 %

## 2015-12-21 NOTE — Progress Notes (Signed)
Normal remote reviewed. VHR episodes are likely AT with 1:1 conduction   Next Carelink 03/18/16

## 2015-12-25 ENCOUNTER — Encounter: Payer: Self-pay | Admitting: Cardiology

## 2016-03-02 ENCOUNTER — Emergency Department (HOSPITAL_COMMUNITY): Payer: Medicare Other

## 2016-03-02 ENCOUNTER — Observation Stay (HOSPITAL_COMMUNITY)
Admission: EM | Admit: 2016-03-02 | Discharge: 2016-03-03 | Disposition: A | Discharge status: Skilled Nursing Facility | Payer: Medicare Other | Attending: Family Medicine | Admitting: Family Medicine

## 2016-03-02 ENCOUNTER — Encounter (HOSPITAL_COMMUNITY): Payer: Self-pay

## 2016-03-02 DIAGNOSIS — Z85828 Personal history of other malignant neoplasm of skin: Secondary | ICD-10-CM | POA: Insufficient documentation

## 2016-03-02 DIAGNOSIS — R519 Headache, unspecified: Secondary | ICD-10-CM

## 2016-03-02 DIAGNOSIS — R040 Epistaxis: Secondary | ICD-10-CM | POA: Insufficient documentation

## 2016-03-02 DIAGNOSIS — R51 Headache: Secondary | ICD-10-CM | POA: Insufficient documentation

## 2016-03-02 DIAGNOSIS — Z8674 Personal history of sudden cardiac arrest: Secondary | ICD-10-CM | POA: Diagnosis not present

## 2016-03-02 DIAGNOSIS — R5383 Other fatigue: Secondary | ICD-10-CM | POA: Diagnosis not present

## 2016-03-02 DIAGNOSIS — K219 Gastro-esophageal reflux disease without esophagitis: Secondary | ICD-10-CM | POA: Insufficient documentation

## 2016-03-02 DIAGNOSIS — Z950 Presence of cardiac pacemaker: Secondary | ICD-10-CM | POA: Insufficient documentation

## 2016-03-02 DIAGNOSIS — F4310 Post-traumatic stress disorder, unspecified: Secondary | ICD-10-CM | POA: Insufficient documentation

## 2016-03-02 DIAGNOSIS — F418 Other specified anxiety disorders: Secondary | ICD-10-CM | POA: Diagnosis not present

## 2016-03-02 DIAGNOSIS — R262 Difficulty in walking, not elsewhere classified: Secondary | ICD-10-CM | POA: Diagnosis not present

## 2016-03-02 DIAGNOSIS — S060X9A Concussion with loss of consciousness of unspecified duration, initial encounter: Principal | ICD-10-CM | POA: Insufficient documentation

## 2016-03-02 DIAGNOSIS — F0390 Unspecified dementia without behavioral disturbance: Secondary | ICD-10-CM | POA: Insufficient documentation

## 2016-03-02 DIAGNOSIS — S060XAA Concussion with loss of consciousness status unknown, initial encounter: Secondary | ICD-10-CM | POA: Diagnosis present

## 2016-03-02 DIAGNOSIS — Z87891 Personal history of nicotine dependence: Secondary | ICD-10-CM | POA: Diagnosis not present

## 2016-03-02 DIAGNOSIS — S060X0A Concussion without loss of consciousness, initial encounter: Secondary | ICD-10-CM

## 2016-03-02 DIAGNOSIS — R531 Weakness: Secondary | ICD-10-CM | POA: Diagnosis not present

## 2016-03-02 MED ORDER — HYDROCODONE-ACETAMINOPHEN 5-325 MG PO TABS
1.0000 | ORAL_TABLET | Freq: Once | ORAL | Status: AC
  Administered 2016-03-02: 1 via ORAL
  Filled 2016-03-02: qty 1

## 2016-03-02 NOTE — ED Notes (Signed)
RN spoke to Autumn Lawson at the facility, she stated that pt does not have a steady gate normally however she does not use a walker or mobility devise.  She stated that the facility is a assisted living facility, there are no RN's on duty and they would not be able to provide her w/ a sitter or assistance "getting around the facility."

## 2016-03-02 NOTE — ED Notes (Signed)
RN was able to ambulate the pt to the end of the hall, she was able to walk unassisted down the hall.  RN assisted pt back to bed and she was given a sandwich, apple sauce and hot tea per her request.
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
## 2016-03-02 NOTE — ED Notes (Signed)
Attempted to call assisted living facility, unsuccessful at making contact: 1. First call hung up on 2. Second call was put on hold for 18 minutes 3. Third call no answer, line was hung up on  Informed provider, will attempt again
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
## 2016-03-02 NOTE — ED Notes (Signed)
RN called daughter to attempt to get another number to call SNF.  She expressed concern that her mother is not well enough to go back, stating that she is concerned the facility would not check on her as they should.  She related that after a fall last August she had a negative CT scan however two days later a "brain bleed" was found on a repeat CT.  RN relayed message and concern to PA.  RN also handed phone to pt so that daughter could call her.

## 2016-03-03 DIAGNOSIS — T82128A Displacement of other cardiac electronic device, initial encounter: Secondary | ICD-10-CM | POA: Diagnosis not present

## 2016-03-03 DIAGNOSIS — H5702 Anisocoria: Secondary | ICD-10-CM

## 2016-03-03 DIAGNOSIS — S060X9A Concussion with loss of consciousness of unspecified duration, initial encounter: Secondary | ICD-10-CM | POA: Diagnosis not present

## 2016-03-03 DIAGNOSIS — S060X0A Concussion without loss of consciousness, initial encounter: Secondary | ICD-10-CM | POA: Diagnosis not present

## 2016-03-03 LAB — CBC WITH DIFFERENTIAL/PLATELET
BASOS ABS: 0 10*3/uL (ref 0.0–0.1)
Basophils Relative: 0 %
EOS PCT: 1 %
Eosinophils Absolute: 0.1 10*3/uL (ref 0.0–0.7)
HEMATOCRIT: 39.9 % (ref 36.0–46.0)
Hemoglobin: 12.7 g/dL (ref 12.0–15.0)
LYMPHS ABS: 1.7 10*3/uL (ref 0.7–4.0)
LYMPHS PCT: 21 %
MCH: 30.9 pg (ref 26.0–34.0)
MCHC: 31.8 g/dL (ref 30.0–36.0)
MCV: 97.1 fL (ref 78.0–100.0)
MONO ABS: 1 10*3/uL (ref 0.1–1.0)
Monocytes Relative: 12 %
NEUTROS ABS: 5.4 10*3/uL (ref 1.7–7.7)
Neutrophils Relative %: 66 %
PLATELETS: 210 10*3/uL (ref 150–400)
RBC: 4.11 MIL/uL (ref 3.87–5.11)
RDW: 12.9 % (ref 11.5–15.5)
WBC: 8.2 10*3/uL (ref 4.0–10.5)

## 2016-03-03 LAB — COMPREHENSIVE METABOLIC PANEL
ALBUMIN: 3.4 g/dL — AB (ref 3.5–5.0)
ALT: 34 U/L (ref 14–54)
ANION GAP: 8 (ref 5–15)
AST: 31 U/L (ref 15–41)
Alkaline Phosphatase: 82 U/L (ref 38–126)
BILIRUBIN TOTAL: 0.7 mg/dL (ref 0.3–1.2)
BUN: 9 mg/dL (ref 6–20)
CHLORIDE: 107 mmol/L (ref 101–111)
CO2: 26 mmol/L (ref 22–32)
Calcium: 8.8 mg/dL — ABNORMAL LOW (ref 8.9–10.3)
Creatinine, Ser: 0.52 mg/dL (ref 0.44–1.00)
GFR calc Af Amer: >60 mL/min (ref >60)
GFR calc non Af Amer: >60 mL/min (ref >60)
GLUCOSE: 100 mg/dL — AB (ref 65–99)
POTASSIUM: 3.6 mmol/L (ref 3.5–5.1)
Sodium: 141 mmol/L (ref 135–145)
TOTAL PROTEIN: 5.7 g/dL — AB (ref 6.5–8.1)

## 2016-03-03 LAB — PROTIME-INR
INR: 1.15 (ref 0.00–1.49)
Prothrombin Time: 14.9 seconds (ref 11.6–15.2)

## 2016-03-03 MED ORDER — SERTRALINE HCL 25 MG PO TABS
25.0000 mg | ORAL_TABLET | Freq: Every day | ORAL | Status: DC
  Administered 2016-03-03: 25 mg via ORAL
  Filled 2016-03-03: qty 1

## 2016-03-03 MED ORDER — ONDANSETRON HCL 4 MG PO TABS: 4.0000 mg | ORAL_TABLET | Freq: Four times a day (QID) | ORAL | Status: DC | PRN

## 2016-03-03 MED ORDER — ONDANSETRON HCL 4 MG/2ML IJ SOLN: 4.0000 mg | Freq: Four times a day (QID) | INTRAMUSCULAR | Status: DC | PRN

## 2016-03-03 MED ORDER — ACETAMINOPHEN 325 MG PO TABS
650.0000 mg | ORAL_TABLET | Freq: Four times a day (QID) | ORAL | Status: DC | PRN
  Administered 2016-03-03: 650 mg via ORAL
  Filled 2016-03-03: qty 2

## 2016-03-03 MED ORDER — HYDROCODONE-ACETAMINOPHEN 5-325 MG PO TABS: 1.0000 | ORAL_TABLET | ORAL | Status: DC | PRN

## 2016-03-03 MED ORDER — ACETAMINOPHEN 650 MG RE SUPP
650.0000 mg | Freq: Four times a day (QID) | RECTAL | Status: DC | PRN
Start: 2016-03-03 — End: 2016-03-03

## 2016-03-03 MED ORDER — DOCUSATE SODIUM 100 MG PO CAPS: 100.0000 mg | ORAL_CAPSULE | Freq: Every day | ORAL | Status: DC

## 2016-03-03 MED ORDER — HYDROCODONE-ACETAMINOPHEN 5-325 MG PO TABS
1.0000 | ORAL_TABLET | ORAL | Status: DC | PRN
  Administered 2016-03-03: 2 via ORAL
  Filled 2016-03-03: qty 2

## 2016-03-03 MED ORDER — DONEPEZIL HCL 5 MG PO TABS: 5.0000 mg | ORAL_TABLET | Freq: Every day | ORAL | Status: DC
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
## 2016-03-03 NOTE — Progress Notes (Addendum)
CSW informed pt is from [facility] and will DC back today- CSW sent DC summary to ALF and confirmed pt could return  Patient will discharge to [facility] Anticipated discharge date: 5/23 Family notified: pt dtr Transportation by dtr Report number:  [phone]- ask for [place] signing off.  Domenica Reamer, [place] Social Worker [phone]
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
## 2016-03-03 NOTE — Progress Notes (Signed)
Pt discharged into daughter, Ashley Royalty, care to be transported to [place]. Pt in stable condition.
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
## 2016-03-03 NOTE — Care Management Note (Signed)
Case Management Note  Patient Details  Name: Autumn Lawson MRN: [id number] Date of Birth: 01-17-1941  Subjective/Objective:                    Action/Plan: Discharge to ALF today.  Expected Discharge Date:   03/03/2016           Expected Discharge Plan:  Assisted Living / Rest Home  In-House Referral:  Clinical Social Work  Discharge planning Services  CM Consult  PoStatus of Service:  Completed, signed off  Sharin Mons, [place] 03/03/2016, 2:59 PM

## 2016-03-16 ENCOUNTER — Telehealth: Payer: Self-pay | Admitting: Neurology

## 2016-03-16 NOTE — Telephone Encounter (Signed)
Returned TC and spoke to pt's husband. Work-in appt scheduled Friday morning. Agreed to arrive @ 7:30 a.m. Will call back if sooner appt becomes available.
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
## 2016-03-18 ENCOUNTER — Telehealth: Payer: Self-pay | Admitting: Cardiology

## 2016-03-18 ENCOUNTER — Encounter: Payer: PRIVATE HEALTH INSURANCE | Admitting: *Deleted

## 2016-03-18 NOTE — Telephone Encounter (Signed)
LMOVM reminding pt to send remote transmission.   

## 2016-03-20 ENCOUNTER — Ambulatory Visit (INDEPENDENT_AMBULATORY_CARE_PROVIDER_SITE_OTHER): Payer: Medicare Other | Admitting: Neurology

## 2016-03-20 ENCOUNTER — Encounter: Payer: Self-pay | Admitting: Cardiology

## 2016-03-20 ENCOUNTER — Encounter: Payer: Self-pay | Admitting: Neurology

## 2016-03-20 VITALS — Ht 65.0 in | Wt 142.8 lb

## 2016-03-20 DIAGNOSIS — R413 Other amnesia: Secondary | ICD-10-CM | POA: Diagnosis not present

## 2016-03-20 DIAGNOSIS — R2681 Unsteadiness on feet: Secondary | ICD-10-CM | POA: Diagnosis not present

## 2016-03-20 DIAGNOSIS — S060X0S Concussion without loss of consciousness, sequela: Secondary | ICD-10-CM

## 2016-03-20 DIAGNOSIS — S060X0A Concussion without loss of consciousness, initial encounter: Secondary | ICD-10-CM | POA: Insufficient documentation

## 2016-03-20 MED ORDER — DONEPEZIL HCL 10 MG PO TABS
10.0000 mg | ORAL_TABLET | Freq: Every day | ORAL | Status: DC
Start: 2016-03-20 — End: 2016-04-02
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
## 2016-03-20 NOTE — Patient Instructions (Signed)
Post-Concussion Syndrome  Post-concussion syndrome describes the symptoms that can occur after a head injury. These symptoms can last from weeks to months.  CAUSES   It is not clear why some head injuries cause post-concussion syndrome. It can occur whether your head injury was mild or severe and whether you were wearing head protection or not.   SIGNS AND SYMPTOMS  · Memory difficulties.  · Dizziness.  · Headaches.  · Double vision or blurry vision.  · Sensitivity to light.  · Hearing difficulties.  · Depression.  · Tiredness.  · Weakness.  · Difficulty with concentration.  · Difficulty sleeping or staying asleep.  · Vomiting.  · Poor balance or instability on your feet.  · Slow reaction time.  · Difficulty learning and remembering things you have heard.  DIAGNOSIS   There is no test to determine whether you have post-concussion syndrome. Your health care provider may order an imaging scan of your brain, such as a CT scan, to check for other problems that may be causing your symptoms (such as a severe injury inside your skull).  TREATMENT   Usually, these problems disappear over time without medical care. Your health care provider may prescribe medicine to help ease your symptoms. It is important to follow up with a neurologist to evaluate your recovery and address any lingering symptoms or issues.  HOME CARE INSTRUCTIONS   · Take medicines only as directed by your health care provider. Do not take aspirin. Aspirin can slow blood clotting.  · Sleep with your head slightly elevated to help with headaches.  · Avoid any situation where there is potential for another head injury. This includes football, hockey, soccer, basketball, martial arts, downhill snow sports, and horseback riding. Your condition will get worse every time you experience a concussion. You should avoid these activities until you are evaluated by the appropriate follow-up health care providers.  · Keep all follow-up visits as directed by your health  care provider. This is important.  SEEK MEDICAL CARE IF:  · You have increased problems paying attention or concentrating.  · You have increased difficulty remembering or learning new information.  · You need more time to complete tasks or assignments than before.  · You have increased irritability or decreased ability to cope with stress.  · You have more symptoms than before.  Seek medical care if you have any of the following symptoms for more than two weeks after your injury:  · Lasting (chronic) headaches.  · Dizziness or balance problems.  · Nausea.  · Vision problems.  · Increased sensitivity to noise or light.  · Depression or mood swings.  · Anxiety or irritability.  · Memory problems.  · Difficulty concentrating or paying attention.  · Sleep problems.  · Feeling tired all the time.  SEEK IMMEDIATE MEDICAL CARE IF:  · You have confusion or unusual drowsiness.  · Others find it difficult to wake you up.  · You have nausea or persistent, forceful vomiting.  · You feel like you are moving when you are not (vertigo). Your eyes may move rapidly back and forth.  · You have convulsions or faint.  · You have severe, persistent headaches that are not relieved by medicine.  · You cannot use your arms or legs normally.  · One of your pupils is larger than the other.  · You have clear or bloody discharge from your nose or ears.  · Your problems are getting worse, not better.  MAKE   SURE YOU:  · Understand these instructions.  · Will watch your condition.  · Will get help right away if you are not doing well or get worse.     This information is not intended to replace advice given to you by your health care provider. Make sure you discuss any questions you have with your health care provider.     Document Released: 03/20/2002 Document Revised: 10/19/2014 Document Reviewed: 01/03/2014  Elsevier Interactive Patient Education ©2016 [employer]vier Inc[employer]

## 2016-03-25 ENCOUNTER — Telehealth: Payer: Self-pay | Admitting: *Deleted

## 2016-03-25 NOTE — Telephone Encounter (Signed)
Received a request from [facility] that patient is requesting to discontinue her Aricept 10mg .  Called and spoke to Eleva at the facility who stated it was the patient's husband that would like the medication stopped.  He feels his wife is sleeping too much since the increase in dosage.  Darlene, aid at facility, states that the patient's sleep pattern is unchanged but they were not able to effectively communicate this with Mr. Karle Starch.

## 2016-03-30 ENCOUNTER — Telehealth: Payer: Self-pay | Admitting: Neurology

## 2016-03-30 NOTE — Telephone Encounter (Signed)
Pt will stop by the office on Thursday to fill out a release form.

## 2016-03-30 NOTE — Telephone Encounter (Signed)
Order was written to discontinue the Aricept.

## 2016-03-30 NOTE — Telephone Encounter (Signed)
Pt's husband called in requesting a copy of her full chart.

## 2016-04-02 ENCOUNTER — Ambulatory Visit: Payer: Medicare Other | Admitting: Neurology

## 2016-04-02 ENCOUNTER — Telehealth: Payer: Self-pay | Admitting: Neurology

## 2016-04-02 ENCOUNTER — Telehealth: Payer: Self-pay | Admitting: *Deleted

## 2016-04-02 MED ORDER — DONEPEZIL HCL 10 MG PO TABS: 10.0000 mg | ORAL_TABLET | Freq: Every day | ORAL | Status: DC

## 2016-04-02 NOTE — Telephone Encounter (Signed)
Patient is calling and states when she went to her pharmacy to get her Rx Aricept 10 mg they told her it had been discontinued. The Rx on file is showing a Rx beginning 03/2016 with 3 refills. Please call patient as she is going out of town. Use her husband's phone [phone] if you cant get her on her cell phone. Thanks!

## 2016-04-02 NOTE — Telephone Encounter (Signed)
I called the husband, he does not appear to understand what is going on, I talk with the daughter who indicates that she wants the patient on Aricept, I will rewrite the prescription, we will fax a prescription in.

## 2016-04-02 NOTE — Telephone Encounter (Signed)
Returned call and spoke to pt's husband. He reports that his wife is a resident of [facility] and she gets all of her meds through the facility. I let him know that Aricept was discontinued as requested per phone note on 03/25/16. Mr. Airington would like for his wife to continue the med @ 5 mg which she seemed to tolerate better than the 10 mg.

## 2016-04-02 NOTE — Telephone Encounter (Signed)
Mariann Laster with [facility] of [place](patient's residence) is calling and states the patient told her she no longer wanted to take the Aricept so she put a stop on the medication.  Mariann Laster is calling and says the patient now wants to take the Aricept.  She is requesting a new Rx be written and faxed to her @ [facility] @fax  [phone].  She will forward to the pharmacy.

## 2016-04-02 NOTE — Telephone Encounter (Signed)
Pt pick up records on 04/02/2016.

## 2016-04-03 NOTE — Telephone Encounter (Signed)
Rx printed, signed, faxed to [facility].

## 2016-05-12 ENCOUNTER — Ambulatory Visit (INDEPENDENT_AMBULATORY_CARE_PROVIDER_SITE_OTHER): Payer: Medicare Other | Admitting: Neurology

## 2016-05-12 ENCOUNTER — Telehealth: Payer: Self-pay | Admitting: Neurology

## 2016-05-12 ENCOUNTER — Encounter: Payer: Self-pay | Admitting: Neurology

## 2016-05-12 VITALS — BP 143/73 | HR 64 | Ht 65.0 in | Wt 141.0 lb

## 2016-05-12 DIAGNOSIS — R413 Other amnesia: Secondary | ICD-10-CM

## 2016-05-12 MED ORDER — MEMANTINE HCL 28 X 5 MG & 21 X 10 MG PO TABS: ORAL_TABLET | ORAL | 0 refills | Status: DC

## 2016-05-12 NOTE — Telephone Encounter (Signed)
[facility] [phone] called to request prior auth for memantine [facility] TITRATION PAK) tablet pack

## 2016-05-12 NOTE — Telephone Encounter (Signed)
The intent was to use generic Namenda taking 5 mg the first week daily, 5 mg twice daily for the next week, and then 5 mg in the morning, 10 mg in the evening for the next week, then go to 10 mg twice daily. Not sure why this would be expensive using generic.
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
## 2016-05-12 NOTE — Telephone Encounter (Signed)
[facility] approval authorizes coverage from 02/12/2016 - 05/12/2017. Member ID Number: [id number]. [facility] notified.

## 2016-05-12 NOTE — Telephone Encounter (Signed)
Collin with [facility] is calling and states that the patients Rx Nemanda Titration is extremely expensive for the patient as written.  He suggests 5 mg 2 X day for the first 2 to 4 weeks, then 10 mg 2 X day thereafter.  Please call.

## 2016-05-13 NOTE — Telephone Encounter (Signed)
Returned call to [employer] and spoke to pharmacist. Let him know that Dr. Jannifer Franklin intended to titrate pt up slowly on medication and would like to keep instructions as written. However, generic medication may be used for cost savings. Verbalized understanding and appreciation for call.

## 2016-05-14 ENCOUNTER — Telehealth: Payer: Self-pay | Admitting: Neurology

## 2016-05-14 DIAGNOSIS — Z9989 Dependence on other enabling machines and devices: Principal | ICD-10-CM

## 2016-05-14 DIAGNOSIS — G4733 Obstructive sleep apnea (adult) (pediatric): Secondary | ICD-10-CM

## 2016-05-14 NOTE — Telephone Encounter (Signed)
Patient's husband is calling regarding possibly scheduling a sleep study for the patient since she has not used her CPAP for a while.

## 2016-05-14 NOTE — Telephone Encounter (Signed)
I called and left a message for the husband. The husband had called about getting an evaluation regarding the CPAP. The patient has not been using her CPAP in sometime, I'll get another evaluation set up.

## 2016-05-14 NOTE — Addendum Note (Signed)
Addended by: Margette Fast on: 05/14/2016 05:34 PM   Modules accepted: Orders

## 2016-05-18 NOTE — Addendum Note (Signed)
Addended by: Margette Fast on: 05/18/2016 11:32 AM   Modules accepted: Orders

## 2016-05-18 NOTE — Telephone Encounter (Signed)
Patient called regarding Namenda, started medication 3-4 days ago, thinks she took this medication yesterday but can't take anymore, made her very sick, very nervous, anxious, couldn't sit still. States doing fine on Aricept.

## 2016-05-18 NOTE — Telephone Encounter (Signed)
I called patient. She is not able to tolerate the Namenda as this results in dizziness. The patient will stop the medication.

## 2016-05-19 ENCOUNTER — Telehealth: Payer: Self-pay | Admitting: Neurology

## 2016-05-19 NOTE — Telephone Encounter (Signed)
Patient's daughter Elana called and stated her mother had a sleep study done in 2014 by [facility] in [place], [place] and states she may not need another evaluation.  The fax # for them is [phone]. Phone [phone]. Elana's phone # is [phone].

## 2016-05-20 NOTE — Telephone Encounter (Signed)
We will request the results of the sleep study, but the sleep physician seeing the patient will need to decide whether the study needs to be repeated or not.

## 2016-05-20 NOTE — Telephone Encounter (Signed)
Request for 2014 sleep study results faxed to [facility].

## 2016-05-21 NOTE — Telephone Encounter (Signed)
Pt called said [facility] needs an order that namenda has been d/c.

## 2016-05-21 NOTE — Telephone Encounter (Signed)
Order to d/c Namenda faxed to [facility]. Pt to continue Aricept per Dr. Jannifer Franklin.

## 2016-05-25 NOTE — Telephone Encounter (Signed)
Received 1 page report today.

## 2016-06-03 ENCOUNTER — Encounter: Payer: Self-pay | Admitting: Neurology

## 2016-06-03 ENCOUNTER — Ambulatory Visit (INDEPENDENT_AMBULATORY_CARE_PROVIDER_SITE_OTHER): Payer: Medicare Other | Admitting: Neurology

## 2016-06-03 VITALS — BP 150/83 | HR 71 | Resp 16 | Ht 65.0 in | Wt 142.0 lb

## 2016-06-03 DIAGNOSIS — G4733 Obstructive sleep apnea (adult) (pediatric): Secondary | ICD-10-CM | POA: Diagnosis not present

## 2016-06-03 DIAGNOSIS — R413 Other amnesia: Secondary | ICD-10-CM | POA: Diagnosis not present

## 2016-06-03 NOTE — Progress Notes (Signed)
Subjective:    Patient ID: Joee Mizenko is a 75 y.o. female.  HPI     Star Age, MD, PhD [facility] [address], [address]O. [place], [place] [address]  Dear Lanny Hurst,   I saw your patient, Jyah Bonnet, upon your kind request in my clinic today for initial consultation of her sleep disorder, in particular, concern for underlying obstructive sleep apnea, for re-evaluation. The patient is accompanied by her husband today. As you know, Ms. Karlton Lemon is a 75 year old right-handed woman with an underlying medical history of vertigo, history of asystole, status post pacemaker insertion, depression, anxiety, elevated LFTs, reflux disease, memory loss, ADD, PTSD, low back pain, status post multiple surgeries including back surgery with rod in place, cholecystectomy, cataract surgeries bilaterally, liver surgery, varicose vein surgery, hysterectomy, who was previously diagnosed with obstructive sleep apnea and placed on CPAP therapy. She has not been using CPAP for at least a year. We were not able to get a CPAP compliance download from her SD card. She had a sleep study at [facility] on 03/27/2013, I reviewed the results, overall AHI was 12.7 per hour, lowest oxygen saturation was 76%, no PLMS were noted, she had reduced sleep efficiency, reduced REM sleep and reduced slow-wave sleep, increase in spontaneous arousals, she had PVCs on EKG. She had a recent overnight pulse oximetry test or oxygen treatment overnight at [facility]. Her husband lives in their home in [place], [place]. She is in Assisted Living.  She was uncomfortable with the CPAP. She would be willing to try it again and come back for a sleep study.  They have 4 grown children, daughter is the closest to here, in [place]. She drinks a glass of wine per day, is a nonsmoker, drinks 2 cups of coffee per day. She tries to drink enough water and tries to exercise.  Her Past Medical  History Is Significant For: Past Medical History:  Diagnosis Date  . ADD (attention deficit disorder with hyperactivity)   . Asystole, 10 sec 06/20/2015  . Depression with anxiety   . Elevated LFTs   . Esophageal reflux   . Gallstone   . Hematoma   . Memory deficits 04/22/2015  . PTSD (post-traumatic stress disorder)   . Skin cancer, basal cell   . Varicose vein of leg    bilateral  . Vertigo   . Vertigo 03/2015    Her Past Surgical History Is Significant For: Past Surgical History:  Procedure Laterality Date  . ABDOMINAL HYSTERECTOMY    . BACK SURGERY     rod in back placed  . CATARACT EXTRACTION     bilateral  . CESAREAN SECTION     x4  . CHOLECYSTECTOMY  1981  . EP IMPLANTABLE DEVICE N/A 06/20/2015   Procedure: Pacemaker Implant;  Surgeon: Evans Lance, MD;  Location: [facility];  Service: Cardiovascular;  Laterality: N/A;  . LIVER SURGERY  1984  . VARICOSE VEIN SURGERY      Her Family History Is Significant For: Family History  Problem Relation Age of Onset  . Leukemia Father   . Alzheimer's disease Mother   . Heart failure Sister   . Healthy Brother   . Healthy Sister   . Healthy Sister   . Healthy Brother   . Prostate cancer Other     paternal [place] grandfather    Her Social History Is Significant For: Social History   Social History  . Marital status: Married  Spouse name: N/A  . Number of children: 4  . Years of education: N/A   Occupational History  . retired Therapist, sports    Social History Main Topics  . Smoking status: Former Smoker    Quit date: 05/14/1976  . Smokeless tobacco: Never Used  . Alcohol use 4.2 oz/week    7 Glasses of wine per week     Comment: wine with dinner  . Drug use: No  . Sexual activity: Not Asked   Other Topics Concern  . None   Social History Narrative   Patient drinks 3 cups of caffeine daily.   Patient is right handed.    Her Allergies Are:  Allergies  Allergen Reactions  . Lactose Intolerance (Gi) Other  (See Comments)    Gas and bloating  . Levaquin [Levofloxacin In D5w] Swelling and Other (See Comments)    Joint swelling  . Namenda [Memantine Hcl]     Dizzy  :   Her Current Medications Are:  Outpatient Encounter Prescriptions as of 06/03/2016  Medication Sig  . docusate sodium (COLACE) 100 MG capsule Take 100 mg by mouth at bedtime.  . donepezil (ARICEPT) 10 MG tablet Take 1 tablet (10 mg total) by mouth at bedtime.  Marland Kitchen HYDROcodone-acetaminophen (NORCO/VICODIN) 5-325 MG tablet Take 1 tablet by mouth every 4 (four) hours as needed for moderate pain.  Marland Kitchen sertraline (ZOLOFT) 25 MG tablet Take 25 mg by mouth daily.   No facility-administered encounter medications on file as of 06/03/2016.   :  Review of Systems:  Out of a complete 14 point review of systems, all are reviewed and negative with the exception of these symptoms as listed below:  Review of Systems  Neurological:       Memory, depression  Patient has not used CPAP in over a year    Epworth Sleepiness Scale 0= would never doze 1= slight chance of dozing 2= moderate chance of dozing 3= high chance of dozing  Sitting and reading:0 Watching TV:1 Sitting inactive in a public place (ex. Theater or meeting):0 As a passenger in a car for an hour without a break:0 Lying down to rest in the afternoon:1 Sitting and talking to someone:0 Sitting quietly after lunch (no alcohol):0 In a car, while stopped in traffic:0 Total:2   Objective:  Neurologic Exam  Physical Exam Physical Examination:   Vitals:   06/03/16 1456  BP: (!) 150/83  Pulse: 71  Resp: 16   General Examination: The patient is a very pleasant 75 y.o. female in no acute distress. She appears well-developed and well-nourished and well groomed.   HEENT: Normocephalic, atraumatic, pupils are equal, round and reactive to light and accommodation. Extraocular tracking is good without limitation to gaze excursion or nystagmus noted. Normal smooth pursuit is  noted. Hearing is grossly intact. Face is symmetric with normal facial animation and normal facial sensation. Speech is clear with no dysarthria noted. There is no hypophonia. There is no lip, neck/head, jaw or voice tremor. Neck is supple with full range of passive and active motion. There are no carotid bruits on auscultation. Oropharynx exam reveals: mild mouth dryness, adequate dental hygiene and mild airway crowding, due to smaller airway and redundant soft palate. Mallampati is class I. Tongue protrudes centrally and palate elevates symmetrically. Tonsils are absent. Neck size is 12 5/8 inches. She has a Mild overbite.   Chest: Clear to auscultation without wheezing, rhonchi or crackles noted.  Heart: S1+S2+0, regular and normal without murmurs, rubs or gallops noted.  Abdomen: Soft, non-tender and non-distended with normal bowel sounds appreciated on auscultation.  Extremities: There is no pitting edema in the distal lower extremities bilaterally, but non-pitting puffiness noted, varicose and spider veins noted.   Skin: Warm and dry without trophic changes noted.   Musculoskeletal: exam reveals no obvious joint deformities, tenderness or joint swelling or erythema.   Neurologically:  Mental status: The patient is awake, alert and oriented in all 4 spheres. Her immediate and remote memory, attention, language skills and fund of knowledge are impaired. Cranial nerves II - XII are as described above under HEENT exam. In addition: shoulder shrug is normal with equal shoulder height noted. Motor exam: Normal bulk, strength and tone is noted. There is no drift, tremor or rebound. Romberg is negative. Reflexes are 2+ throughout. Fine motor skills and coordination: intact with normal finger taps, normal hand movements, normal rapid alternating patting, normal foot taps and normal foot agility.  Cerebellar testing: No dysmetria or intention tremor on finger to nose testing. Heel to shin is  unremarkable bilaterally. There is no truncal or gait ataxia.  Sensory exam: intact to light touch in the upper and lower extremities.  Gait, station and balance: She stands easily. No veering to one side is noted. No leaning to one side is noted. Posture is age-appropriate and stance is narrow based. Gait shows normal stride length and normal pace. No problems turning are noted.                Assessment and Plan:   In summary, Parish Durie is a very pleasant 75 y.o.-year old female with an underlying medical history of vertigo, history of asystole, status post pacemaker insertion, depression, anxiety, elevated LFTs, reflux disease, memory loss, ADD, PTSD, low back pain, status post multiple surgeries including back surgery with rod in place, cholecystectomy, cataract surgeries bilaterally, liver surgery, varicose vein surgery, hysterectomy, who was previously diagnosed with obstructive sleep apnea and placed on CPAP therapy. She stopped using CPAP some time ago. Given her medical and cardiac history, and her memory issues, she would Benefit from reevaluation and treatment with CPAP. She is willing to come back for sleep study and consider CPAP therapy again.  I had a long chat with the patient and her husband about my findings and the diagnosis of OSA, its prognosis and treatment options. We talked about medical treatments, surgical interventions and non-pharmacological approaches. I explained in particular the risks and ramifications of untreated moderate to severe OSA, especially with respect to developing cardiovascular disease down the Road, including congestive heart failure, difficult to treat hypertension, cardiac arrhythmias, or stroke. Even type 2 diabetes has, in part, been linked to untreated OSA. Symptoms of untreated OSA include daytime sleepiness, memory problems, mood irritability and mood disorder such as depression and anxiety, lack of energy, as well as recurrent headaches, especially  morning headaches. We talked about trying to maintain a healthy lifestyle in general, as well as the importance of weight control. I encouraged the patient to eat healthy, exercise daily and keep well hydrated, to keep a scheduled bedtime and wake time routine, to not skip any meals and eat healthy snacks in between meals. I advised the patient not to drive when feeling sleepy. I recommended the following at this time: sleep study with potential positive airway pressure titration. (We will score hypopneas at 4% and split the sleep study into diagnostic and treatment portion, if the estimated. 2 hour AHI is >15/h).he Her   I explained the sleep test  procedure to the patient and also outlined possible surgical and non-surgical treatment options of OSA, including the use of a custom-made dental device (which would require a referral to a specialist dentist or oral surgeon), upper airway surgical options, such as pillar implants, radiofrequency surgery, tongue base surgery, and UPPP (which would involve a referral to an ENT surgeon). Rarely, jaw surgery such as mandibular advancement may be considered.  I also explained the CPAP treatment option to the patient, who indicated that she would be willing to try CPAP if the need arises. I explained the importance of being compliant with PAP treatment, not only for insurance purposes but primarily to improve Her symptoms, and for the patient's long term health benefit, including to reduce Her cardiovascular risks. I answered all their questions today and the patient and her husband were in agreement. I would like to see her back after the sleep study is completed and encouraged her to call with any interim questions, concerns, problems or updates.   Thank you very much for allowing me to participate in the care of this nice patient. If I can be of any further assistance to you please do not hesitate to talk to me.   Sincerely,   Star Age, MD, PhD

## 2016-06-03 NOTE — Patient Instructions (Signed)

## 2016-06-09 ENCOUNTER — Telehealth: Payer: Self-pay | Admitting: *Deleted
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
## 2016-06-09 NOTE — Telephone Encounter (Signed)
I called the patient. Not completely sure what it is that she was wanting, she indicates that she feels like she is thinking better on the medication. She indicates that she wants to go back to her own home.  We will continue to follow the memory issues over time.

## 2016-06-18 NOTE — Telephone Encounter (Signed)
I called patient. The patient had some concerns about a sleep evaluation that was done where she wore oxygen at night, and was monitored. The patient does not have the results at this time, I will refer her to Dr. Rexene Alberts. The patient was wondering why this test was done rather than a standard sleep evaluation in the sleep lab.

## 2016-06-18 NOTE — Telephone Encounter (Signed)
Pt called said "there was a mix up with the sleep study". She did not want to go into detail with me , she said it was a very detailed msg". Pls call at [phone]

## 2016-06-23 ENCOUNTER — Telehealth: Payer: Self-pay | Admitting: Neurology

## 2016-06-23 NOTE — Telephone Encounter (Signed)
If she had a sleep study already, it will be best for her to see the doctor who interpreted that study. Please call daughter to notify. I was under the impression, that she did not have a sleep study and therefore ordered one for [place] to do here.

## 2016-06-23 NOTE — Telephone Encounter (Signed)
Called patients daughter to see when I could schedule her mother.  Patients daughter said the nursing home did a sleep study and would like for Dr. Rexene Alberts to see if she really needs to come and have sleep study here.  She is worried that Medicare will not pay for 2 sleep studies.  Daughter states mother has memory loss and can't find out the information.  Patients daughter is listed on the information as a person we can contact.

## 2016-06-23 NOTE — Telephone Encounter (Signed)
Autumn Lawson, I ordered a sleep study when I saw her on 06/03/16. She does not have an appointment yet, can you look into this? Thx, please see phone notes too.

## 2016-06-24 NOTE — Telephone Encounter (Signed)
Called cell and home number. Cell had no VM. Left message on home phone that we were under the impression that she has never had a sleep study. Insurance will not pay for 2. Any sleep related issues need to be addressed by provider that ordered study and interpreted the study. Left call back number for further questions.

## 2016-06-29 ENCOUNTER — Telehealth: Payer: Self-pay | Admitting: Neurology

## 2016-06-29 DIAGNOSIS — G4733 Obstructive sleep apnea (adult) (pediatric): Secondary | ICD-10-CM

## 2016-06-29 NOTE — Telephone Encounter (Signed)
Please see about getting some information about this from [facility]. Don't have sleep study results.

## 2016-06-29 NOTE — Telephone Encounter (Signed)
Called to find out if the sleep study from [facility] could be used by Dr. Rexene Alberts to adjust the CPAP machine the patient has at this time.  Jiles Garter would like a call back today to know what her next step is in the process

## 2016-06-30 NOTE — Telephone Encounter (Signed)
Just received 2 page study, placed on Dr. Guadelupe Sabin desk for further.

## 2016-06-30 NOTE — Addendum Note (Signed)
Addended by: Star Age on: 06/30/2016 11:41 AM   Modules accepted: Orders

## 2016-06-30 NOTE — Telephone Encounter (Signed)
I called daughter back and gave results and recommendations. She is willing for patient to come back for titration study. Order is in. Please call daughter at [phone] to schedule. Thank you.

## 2016-06-30 NOTE — Telephone Encounter (Signed)
I called [facility]. They were able to locate sleep study and are faxing over now.

## 2016-06-30 NOTE — Telephone Encounter (Signed)
Patient had a home sleep test on 05/25/2016, unclear who ordered it. Study suggests an AHI of 14 per hour, oxygen desaturation nadir of 76.9%, I recommend a CPAP titration study for treatment of OSA, and I placed an order for the study. Dawn: pls call patient back to schedule.

## 2016-07-16 ENCOUNTER — Ambulatory Visit (INDEPENDENT_AMBULATORY_CARE_PROVIDER_SITE_OTHER): Payer: Medicare Other | Admitting: Neurology

## 2016-07-16 DIAGNOSIS — G4733 Obstructive sleep apnea (adult) (pediatric): Secondary | ICD-10-CM | POA: Diagnosis not present

## 2016-07-16 DIAGNOSIS — G4720 Circadian rhythm sleep disorder, unspecified type: Secondary | ICD-10-CM

## 2016-07-16 DIAGNOSIS — G4761 Periodic limb movement disorder: Secondary | ICD-10-CM

## 2016-07-27 ENCOUNTER — Encounter: Payer: Self-pay | Admitting: Internal Medicine

## 2016-07-27 ENCOUNTER — Telehealth: Payer: Self-pay | Admitting: Neurology

## 2016-07-27 DIAGNOSIS — G4733 Obstructive sleep apnea (adult) (pediatric): Secondary | ICD-10-CM

## 2016-07-27 NOTE — Telephone Encounter (Signed)
Patient referred by Dr. Jannifer Franklin, seen on 06/03/16, had a recent HST at her retirement home and I ordered CPAP titration.  Please call and inform patient that I have entered an order for treatment with positive airway pressure (PAP) treatment of obstructive sleep apnea (OSA). She did well during the latest sleep study with CPAP. We will, therefore, arrange for a machine for home use through a DME (durable medical equipment) company of Her choice; and I will see the patient back in follow-up in about 8-10 weeks. Please also explain to the patient that I will be looking out for compliance data, which can be downloaded from the machine (stored on an SD card, that is inserted in the machine) or via remote access through a modem, that is built into the machine. At the time of the followup appointment we will discuss sleep study results and how it is going with PAP treatment at home. Please advise patient to bring Her machine at the time of the first FU visit, even though this is cumbersome. Bringing the machine for every visit after that will likely not be needed, but often helps for the first visit to troubleshoot if needed. Please re-enforce the importance of compliance with treatment and the need for [place] to monitor compliance data - often an insurance requirement and actually good feedback for the patient as far as how they are doing.  Also remind patient, that any interim PAP machine or mask issues should be first addressed with the DME company, as they can often help better with technical and mask fit issues. Please ask if patient has a preference regarding DME company.  Please also make sure, the patient has a follow-up appointment with me in about 8-10 weeks from the setup date, thanks.  Once you have spoken to the patient - and faxed/routed report to PCP and referring MD (if other than PCP), you can close this encounter, thanks,   Star Age, MD, PhD [facility] ([place])

## 2016-07-27 NOTE — Progress Notes (Signed)
PATIENT'S NAME:  Autumn Lawson, Autumn Lawson DOB:      11-24-1940      MR#:    [id number]     DATE OF RECORDING: 07/16/2016 REFERRING M.D.: Margette Fast, MD, PCP: Ledell Peoples, MD Study Performed:   CPAP  Titration HISTORY:  75 year old right-handed woman with an underlying medical history of vertigo, history of asystole, status post pacemaker insertion, depression, anxiety, elevated LFTs, reflux disease, memory loss, ADD, PTSD, low back pain, status post multiple surgeries including back surgery with rod in place, cholecystectomy, cataract surgeries bilaterally, liver surgery, varicose vein surgery, hysterectomy, who was previously diagnosed with obstructive sleep apnea and placed on CPAP therapy. She had a HST through her retirement community on 05/25/16, which showed an AHI of 14/h, O2 nadir of 77%. The patient endorsed the Epworth Sleepiness Scale at 2/24 points. The patient's weight 142 pounds with a height of 65 (inches), resulting in a BMI of 23.5 kg/m2. The patient's neck circumference measured 15.9 inches.  CURRENT MEDICATIONS: Docusate Sodium, Donepezil, Hydrocodone-Acetaminophen and Sertraline    PROCEDURE:  This is a multichannel digital polysomnogram utilizing the SomnoStar 11.2 system.  Electrodes and sensors were applied and monitored per AASM Specifications.   EEG, EOG, Chin and Limb EMG, were sampled at 200 Hz.  ECG, Snore and Nasal Pressure, Thermal Airflow, Respiratory Effort, CPAP Flow and Pressure, Oximetry was sampled at 50 Hz. Digital video and audio were recorded.       CPAP was initiated at 5 cmH20 with heated humidity per AASM split night standards and pressure was advanced to 6 cmH20 because of hypopneas, apneas and desaturations.  At a PAP pressure of 6 cmH20, there was a reduction of the AHI to 0 per hour.    Lights Out was at 21:40 and Lights On at 05:24. Total recording time (TRT) was 464.5 minutes, with a total sleep time (TST) of 359.5 minutes. The patient's sleep latency was 66  minutes with 84 minutes of wake time after sleep onset. REM latency was 290.5 minutes, which is markedly prolonged.  The sleep efficiency was 77.4 %.   SLEEP ARCHITECTURE: Stage N1 5.4%, Stage N2 83.6%, Stage N3 0% and Stage R (REM sleep) 11.%. The sleep architecture was notable for moderate sleep fragmentation. RESPIRATORY ANALYSIS:  There was a total of 1 respiratory events: 0 obstructive apneas, 0 central apneas and 0 mixed apneas with a total of 0 apneas and an apnea index (AI) of 0. There were 1 hypopneas with a hypopnea index of .2. The patient also had 0 respiratory event related arousals (RERAs).     The total APNEA/HYPOPNEA INDEX  (AHI) was .2 and the total RESPIRATORY DISTURBANCE INDEX was .2.  0 events occurred in REM sleep and 1 events in NREM. The REM AHI was 0, versus a non-REM AHI of .2. The patient spent 100% of total sleep time in the supine position. The supine AHI was 0.2, versus a non-supine AHI of 0.0. OXYGEN SATURATION & C02:  The baseline 02 saturation was 96%, with the lowest being 82%. Time spent below 89% saturation equaled 0 minutes.  PERIODIC LIMB MOVEMENTS:   The patient had a total of 76 Periodic Limb Movements. The Periodic Limb Movement (PLM) index was 12.7 and the PLM Arousal index was 1.5  The Technoligist noted the patient was fitted with a Resmed AirFit P10 (Small) apparatus.  DIAGNOSIS 1.  Obstructive Sleep Apnea  2.  Periodic Limb Movement Disorder  3. Dysfunctions associated with sleep stages or arousal from sleep  PLANS/RECOMMENDATIONS: 1.  This is a successful overnight CPAP titration study with elimination of the patient's obstructive sleep disordered breathing with optimal CPAP settings. I will, therefore, start the patient on home CPAP therapy at a pressure of 6, via nasal pillows interface and heated humidification. The patient should be encouraged to be fully compliant with the use of positive airway pressure, since its regular use may aid in reducing  daytime symptoms and improve cardiovascular risks long term. Furthermore, the patient should be advised that it may take up to 3 months to get fully used to using CPAP nightly and with all planned sleep. 2.  Mild PLMs were noted with minimal arousals; clinical correlation is recommended. Medication effect from her antidepressant medication should be considered.  3.  Please note that untreated obstructive sleep apnea carries additional perioperative morbidity. Patients with significant obstructive sleep apnea should receive perioperative PAP therapy and the surgeons and particularly the anesthesiologist should be informed of the diagnosis and the severity of the sleep disordered breathing. 4.  This study shows sleep fragmentation and abnormal sleep stage percentages; these are nonspecific findings and per se do not signify an intrinsic sleep disorder or a cause for the patient's sleep-related symptoms. Causes include (but are not limited to) the first night effect of the sleep study, circadian rhythm disturbances, medication effect or an underlying mood disorder or medical problem.  5. A follow up appointment will be scheduled in the Sleep Clinic at [facility]. Her referring provider will be notified of the results.    I certify that I have reviewed the entire raw data recording prior to the issuance of this report in accordance with the Standards of Accreditation of the American Academy of Sleep Medicine (AASM)       Star Age, MD, PhD Diplomat, American Board of Psychiatry and Neurology  Diplomat, [employer]

## 2016-07-27 NOTE — Telephone Encounter (Signed)
I spoke to daughter. She is aware of results and recommendations. They are willing to start treatment. I will send orders to DME company. Daughter is aware to call back for f/u appt. I will send results to PCP. Patient will also get a letter reminding her to make f/u appt and stress the importance of compliance.

## 2016-07-28 ENCOUNTER — Encounter: Payer: Self-pay | Admitting: Internal Medicine

## 2016-07-28 ENCOUNTER — Encounter (INDEPENDENT_AMBULATORY_CARE_PROVIDER_SITE_OTHER): Payer: Self-pay

## 2016-07-28 ENCOUNTER — Ambulatory Visit (INDEPENDENT_AMBULATORY_CARE_PROVIDER_SITE_OTHER): Payer: Medicare Other | Admitting: Internal Medicine

## 2016-07-28 DIAGNOSIS — Z950 Presence of cardiac pacemaker: Secondary | ICD-10-CM | POA: Diagnosis not present

## 2016-07-28 NOTE — Patient Instructions (Signed)
Medication Instructions:  Your physician recommends that you continue on your current medications as directed. Please refer to the Current Medication list given to you today.   Labwork: None Ordered   Testing/Procedures: None Ordered   Follow-Up: Your physician wants you to follow-up in: 1 year with Dr. Lovena Le. You will receive a reminder letter in the mail two months in advance. If you don't receive a letter, please call our office to schedule the follow-up appointment.  Remote monitoring is used to monitor your Pacemaker of ICD from home. This monitoring reduces the number of office visits required to check your device to one time per year. It allows [place] to keep an eye on the functioning of your device to ensure it is working properly. You are scheduled for a device check from home on 10/27/16. You may send your transmission at any time that day. If you have a wireless device, the transmission will be sent automatically. After your physician reviews your transmission, you will receive a postcard with your next transmission date.     Any Other Special Instructions Will Be Listed Below (If Applicable).     If you need a refill on your cardiac medications before your next appointment, please call your pharmacy.

## 2016-07-28 NOTE — Progress Notes (Signed)
HPI Autumn Lawson returns today for followup. She is a pleasant 75 yo woman with a h/o symptomatic bradycardia,s /p PPM insertion. She has had no additional syncope since her PPM was placed. She has minimal edema. She is seeing Dr. Jannifer Franklin for her memory.  Allergies  Allergen Reactions  . Lactose Intolerance (Gi) Other (See Comments)    Gas and bloating  . Levaquin [Levofloxacin In D5w] Swelling and Other (See Comments)    Joint swelling  . Namenda [Memantine Hcl]     Dizzy     Current Outpatient Prescriptions  Medication Sig Dispense Refill  . docusate sodium (COLACE) 100 MG capsule Take 100 mg by mouth at bedtime.    . donepezil (ARICEPT) 10 MG tablet Take 1 tablet (10 mg total) by mouth at bedtime. 90 tablet 3  . HYDROcodone-acetaminophen (NORCO/VICODIN) 5-325 MG tablet Take 1 tablet by mouth every 4 (four) hours as needed for moderate pain. 30 tablet 0  . sertraline (ZOLOFT) 25 MG tablet Take 25 mg by mouth daily.     No current facility-administered medications for this visit.      Past Medical History:  Diagnosis Date  . ADD (attention deficit disorder with hyperactivity)   . Asystole, 10 sec 06/20/2015  . Depression with anxiety   . Elevated LFTs   . Esophageal reflux   . Gallstone   . Hematoma   . Memory deficits 04/22/2015  . PTSD (post-traumatic stress disorder)   . Skin cancer, basal cell   . Varicose vein of leg    bilateral  . Vertigo   . Vertigo 03/2015    ROS:   All systems reviewed and negative except as noted in the HPI.   Past Surgical History:  Procedure Laterality Date  . ABDOMINAL HYSTERECTOMY    . BACK SURGERY     rod in back placed  . CATARACT EXTRACTION     bilateral  . CESAREAN SECTION     x4  . CHOLECYSTECTOMY  1981  . EP IMPLANTABLE DEVICE N/A 06/20/2015   Procedure: Pacemaker Implant;  Surgeon: Evans Lance, MD;  Location: [facility];  Service: Cardiovascular;  Laterality: N/A;  . LIVER SURGERY  1984  . VARICOSE VEIN  SURGERY       Family History  Problem Relation Age of Onset  . Leukemia Father   . Alzheimer's disease Mother   . Heart failure Sister   . Healthy Brother   . Healthy Sister   . Healthy Sister   . Healthy Brother   . Prostate cancer Other     paternal [place] grandfather     Social History   Social History  . Marital status: Married    Spouse name: N/A  . Number of children: 4  . Years of education: N/A   Occupational History  . retired Therapist, sports    Social History Main Topics  . Smoking status: Former Smoker    Quit date: 05/14/1976  . Smokeless tobacco: Never Used  . Alcohol use 4.2 oz/week    7 Glasses of wine per week     Comment: wine with dinner  . Drug use: No  . Sexual activity: Not on file   Other Topics Concern  . Not on file   Social History Narrative   Patient drinks 3 cups of caffeine daily.   Patient is right handed.     BP (!) 148/78   Pulse 69   Ht 5\' 5"  (1.651 m)  Wt 143 lb (64.9 kg)   SpO2 96%   BMI 23.80 kg/m   Physical Exam:  Well appearing 75 yo woman, NAD HEENT: Unremarkable Neck:  6 cm JVD, no thyromegally Lymphatics:  No adenopathy Back:  No CVA tenderness Lungs:  Clear with no wheezes HEART:  Regular rate rhythm, no murmurs, no rubs, no clicks Abd:  soft, positive bowel sounds, no organomegally, no rebound, no guarding Ext:  2 plus pulses, no edema, no cyanosis, no clubbing Skin:  No rashes no nodules Neuro:  CN II through XII intact, motor grossly intact  EKG - nsr  DEVICE  Normal device function.  See PaceArt for details.   Assess/Plan: 1. Syncope - she has had no recurrent episodes since her PPM. Will follow. 2. PPM - her Medtronic DDD PM is working normally. Will recheck in several months. 3. HTN - her blood pressure is a little high. With her h/o syncope, I would suggest she be allowed to maintain her blood pressure on the high side.  [name].D.

## 2016-07-30 NOTE — Telephone Encounter (Signed)
I was able to obtain copy of first sleep study and office notes before study. Copy sent to be scanned.

## 2016-08-06 NOTE — Telephone Encounter (Signed)
Pt called in wanting to know her results. She would also like to know what DME company has been used. If possible could she use [facility]? She is a resident there. Please call her cell at [phone].

## 2016-08-06 NOTE — Telephone Encounter (Signed)
I spoke to patient and she is aware of results and recommendations. She is aware that I sent orders to [facility]. I gave them her contact number just in case. She voiced understanding and will call back if needed.

## 2016-08-13 ENCOUNTER — Emergency Department (HOSPITAL_COMMUNITY): Payer: Medicare Other

## 2016-08-13 ENCOUNTER — Emergency Department (HOSPITAL_COMMUNITY)
Admission: EM | Admit: 2016-08-13 | Discharge: 2016-08-13 | Disposition: A | Discharge status: Home / Self Care | Payer: Medicare Other | Attending: Emergency Medicine | Admitting: Emergency Medicine

## 2016-08-13 ENCOUNTER — Encounter (HOSPITAL_COMMUNITY): Payer: Self-pay | Admitting: Emergency Medicine

## 2016-08-13 DIAGNOSIS — R10817 Generalized abdominal tenderness: Secondary | ICD-10-CM | POA: Diagnosis not present

## 2016-08-13 DIAGNOSIS — Z950 Presence of cardiac pacemaker: Secondary | ICD-10-CM | POA: Insufficient documentation

## 2016-08-13 DIAGNOSIS — Z85828 Personal history of other malignant neoplasm of skin: Secondary | ICD-10-CM | POA: Insufficient documentation

## 2016-08-13 DIAGNOSIS — Z87891 Personal history of nicotine dependence: Secondary | ICD-10-CM | POA: Insufficient documentation

## 2016-08-13 DIAGNOSIS — R0789 Other chest pain: Secondary | ICD-10-CM | POA: Insufficient documentation

## 2016-08-13 LAB — BASIC METABOLIC PANEL
ANION GAP: 6 (ref 5–15)
BUN: 12 mg/dL (ref 6–20)
CO2: 26 mmol/L (ref 22–32)
Calcium: 9.2 mg/dL (ref 8.9–10.3)
Chloride: 109 mmol/L (ref 101–111)
Creatinine, Ser: 0.59 mg/dL (ref 0.44–1.00)
GFR calc Af Amer: >60 mL/min (ref >60)
GLUCOSE: 93 mg/dL (ref 65–99)
POTASSIUM: 3.9 mmol/L (ref 3.5–5.1)
Sodium: 141 mmol/L (ref 135–145)

## 2016-08-13 LAB — I-STAT TROPONIN, ED
Troponin i, poc: 0 ng/mL (ref 0.00–0.08)
Troponin i, poc: 0 ng/mL (ref 0.00–0.08)

## 2016-08-13 LAB — CBC WITH DIFFERENTIAL/PLATELET
BASOS ABS: 0 10*3/uL (ref 0.0–0.1)
Basophils Relative: 0 %
Eosinophils Absolute: 0.1 10*3/uL (ref 0.0–0.7)
Eosinophils Relative: 1 %
HEMATOCRIT: 40.5 % (ref 36.0–46.0)
HEMOGLOBIN: 13.5 g/dL (ref 12.0–15.0)
LYMPHS PCT: 23 %
Lymphs Abs: 1.4 10*3/uL (ref 0.7–4.0)
MCH: 33 pg (ref 26.0–34.0)
MCHC: 33.3 g/dL (ref 30.0–36.0)
MCV: 99 fL (ref 78.0–100.0)
MONO ABS: 0.6 10*3/uL (ref 0.1–1.0)
Monocytes Relative: 9 %
NEUTROS ABS: 4.1 10*3/uL (ref 1.7–7.7)
Neutrophils Relative %: 67 %
Platelets: 203 10*3/uL (ref 150–400)
RBC: 4.09 MIL/uL (ref 3.87–5.11)
RDW: 13.1 % (ref 11.5–15.5)
WBC: 6.2 10*3/uL (ref 4.0–10.5)

## 2016-08-13 LAB — HEPATIC FUNCTION PANEL
ALBUMIN: 3.7 g/dL (ref 3.5–5.0)
ALK PHOS: 80 U/L (ref 38–126)
ALT: 28 U/L (ref 14–54)
AST: 24 U/L (ref 15–41)
Bilirubin, Direct: 0.1 mg/dL (ref 0.1–0.5)
Indirect Bilirubin: 0.2 mg/dL — ABNORMAL LOW (ref 0.3–0.9)
TOTAL PROTEIN: 6.1 g/dL — AB (ref 6.5–8.1)
Total Bilirubin: 0.3 mg/dL (ref 0.3–1.2)

## 2016-08-13 LAB — LIPASE, BLOOD: Lipase: 48 U/L (ref 11–51)

## 2016-08-13 NOTE — ED Notes (Signed)
Patient transported to X-ray 

## 2016-08-13 NOTE — ED Triage Notes (Signed)
Pt woke up at 2am with CP, non radiating. Denies NV. Pt has rods in back and started having back pain last night as well. EKG SR. EMS gave 324mg  ASA and no nitro. 148/75, HR 70.

## 2016-08-13 NOTE — ED Provider Notes (Signed)
[facility] DEPT Provider Note   CSN: [id number] Arrival date & time: 08/13/16  [id number]     History   Chief Complaint Chief Complaint  Patient presents with  . Chest Pain    HPI Autumn Lawson is a 75 y.o. female.  The history is provided by the patient. No language interpreter was used.  Chest Pain     Autumn Lawson is a 75 y.o. female who presents to the Emergency Department complaining of abdominal pain.  At 2 AM she awoke from sleep with sudden onset pain. She describes it as chest pain but she points to her generalized abdominal region when she localizes the pain. She feels as if there is something tight and they are in it is difficult to take a deep breath. She denies any fevers, nausea, vomiting, diaphoresis, shortness of breath, cough. She has chronic intermittent lower extremity swelling and states that her legs feel swollen today. No prior similar symptoms. She has a history of cholecystectomy for gangrenous gallbladder. She has no history of blood clots or cardiac disease. Past Medical History:  Diagnosis Date  . ADD (attention deficit disorder with hyperactivity)   . Asystole, 10 sec 06/20/2015  . Depression with anxiety   . Elevated LFTs   . Esophageal reflux   . Gallstone   . Hematoma   . Memory deficits 04/22/2015  . PTSD (post-traumatic stress disorder)   . Skin cancer, basal cell   . Varicose vein of leg    bilateral  . Vertigo   . Vertigo 03/2015    Patient Active Problem List   Diagnosis Date Noted  . Concussion without loss of consciousness 03/20/2016  . Concussion 03/02/2016  . Pacemaker 09/18/2015  . Asystole, 10 sec 06/20/2015  . Sinus node dysfunction ([place]) 06/20/2015  . Syncope 06/20/2015  . Syncope and collapse 04/22/2015  . Memory deficits 04/22/2015  . Subdural hemorrhage following injury, hx    . Subdural hematoma ([facility])   . Dizziness   . SDH (subdural hematoma) ([place]) 03/20/2015  . Depression 03/20/2015  . Unsteady gait 03/20/2015  .  Vertigo 03/20/2015    Past Surgical History:  Procedure Laterality Date  . ABDOMINAL HYSTERECTOMY    . BACK SURGERY     rod in back placed  . CATARACT EXTRACTION     bilateral  . CESAREAN SECTION     x4  . CHOLECYSTECTOMY  1981  . EP IMPLANTABLE DEVICE N/A 06/20/2015   Procedure: Pacemaker Implant;  Surgeon: Evans Lance, MD;  Location: [facility];  Service: Cardiovascular;  Laterality: N/A;  . LIVER SURGERY  1984  . VARICOSE VEIN SURGERY      OB History    No data available       Home Medications    Prior to Admission medications   Medication Sig Start Date End Date Taking? Authorizing Provider  docusate sodium (COLACE) 100 MG capsule Take 100 mg by mouth at bedtime.   Yes Historical Provider, MD  donepezil (ARICEPT) 10 MG tablet Take 1 tablet (10 mg total) by mouth at bedtime. 04/02/16  Yes Kathrynn Ducking, MD  ondansetron (ZOFRAN) 4 MG tablet Take 4 mg by mouth every 6 (six) hours as needed for nausea or vomiting.   Yes Historical Provider, MD  polyethylene glycol (MIRALAX / GLYCOLAX) packet Take 17 g by mouth daily as needed for mild constipation.   Yes Historical Provider, MD  sertraline (ZOLOFT) 25 MG tablet Take 50 mg by mouth daily.  Yes Historical Provider, MD  HYDROcodone-acetaminophen (NORCO/VICODIN) 5-325 MG tablet Take 1 tablet by mouth every 4 (four) hours as needed for moderate pain. Patient not taking: Reported on 08/13/2016 03/03/16   Oswald Hillock, MD    Family History Family History  Problem Relation Age of Onset  . Leukemia Father   . Alzheimer's disease Mother   . Heart failure Sister   . Healthy Brother   . Healthy Sister   . Healthy Sister   . Healthy Brother   . Prostate cancer Other     paternal [place] grandfather    Social History Social History  Substance Use Topics  . Smoking status: Former Smoker    Quit date: 05/14/1976  . Smokeless tobacco: Never Used  . Alcohol use 4.2 oz/week    7 Glasses of wine per week     Comment:  wine with dinner     Allergies   Lactose intolerance (gi); Levaquin [levofloxacin in d5w]; and Namenda [memantine hcl]   Review of Systems Review of Systems  Cardiovascular: Positive for chest pain.  All other systems reviewed and are negative.    Physical Exam Updated Vital Signs BP 126/83   Pulse 87   Temp 97.7 F (36.5 C) (Oral)   Resp 16   Ht 5\' 5"  (1.651 m)   Wt 134 lb (60.8 kg)   SpO2 98%   BMI 22.30 kg/m   Physical Exam  Constitutional: She is oriented to person, place, and time. She appears well-developed and well-nourished.  HENT:  Head: Normocephalic and atraumatic.  Cardiovascular: Normal rate and regular rhythm.   No murmur heard. Pulmonary/Chest: Effort normal and breath sounds normal. No respiratory distress.  Abdominal: Soft. There is no rebound and no guarding.  Mild diffuse abdominal tenderness  Musculoskeletal: She exhibits no edema or tenderness.  Neurological: She is alert and oriented to person, place, and time.  Skin: Skin is warm and dry.  Psychiatric: She has a normal mood and affect. Her behavior is normal.  Nursing note and vitals reviewed.    ED Treatments / Results  Labs (all labs ordered are listed, but only abnormal results are displayed) Labs Reviewed  HEPATIC FUNCTION PANEL - Abnormal; Notable for the following:       Result Value   Total Protein 6.1 (*)    Indirect Bilirubin 0.2 (*)    All other components within normal limits  BASIC METABOLIC PANEL  CBC WITH DIFFERENTIAL/PLATELET  LIPASE, BLOOD  I-STAT TROPOININ, ED  Randolm Idol, ED    EKG  EKG Interpretation  Date/Time:  Thursday August 13 2016 10:06:24 EDT Ventricular Rate:  72 PR Interval:    QRS Duration: 99 QT Interval:  384 QTC Calculation: 421 R Axis:   33 Text Interpretation:  ATRIAL PACED RHYTHM Consider anterior infarct Confirmed by Hazle Coca [phone]) on 08/13/2016 10:12:06 AM       Radiology Dg Chest 2 View  Result Date:  08/13/2016 CLINICAL DATA:  Chest pain. Shortness of breath. Symptoms this morning. EXAM: CHEST  2 VIEW COMPARISON:  03/02/2016 FINDINGS: Lower thoracic spine fixation. Mild thoracic spondylosis. Similar position of the pacer battery, flipped since 06/21/2015. No lead discontinuity. Midline trachea. Normal heart size. Tortuous thoracic aorta. No pleural effusion or pneumothorax. Left base scarring. Somewhat nodular component is identified, not readily apparent on priors. IMPRESSION: No acute cardiopulmonary disease. Left base scarring. Probable nodular scarring laterally. Not readily apparent on prior exams. Potential clinical strategies include followup with chest radiograph in 6 months or  further evaluation with chest CT. Electronically Signed   By: Abigail Miyamoto M.D.   On: 08/13/2016 11:34    Procedures Procedures (including critical care time)  Medications Ordered in ED Medications - No data to display   Initial Impression / Assessment and Plan / ED Course  I have reviewed the triage vital signs and the nursing notes.  Pertinent labs & imaging results that were available during my care of the patient were reviewed by me and considered in my medical decision making (see chart for details).  Clinical Course    Patient here for evaluation of chest pain/upper abdominal pain. Episode began at 2 AM and is resolved in the emergency department. Source of pain is not currently identified. Presentation is not consistent with ACS, PE, cholecystitis. Discussed close outpatient follow-up, home care, return precautions.  Final Clinical Impressions(s) / ED Diagnoses   Final diagnoses:  Atypical chest pain    New Prescriptions Discharge Medication List as of 08/13/2016  3:41 PM       Quintella Reichert, MD 08/14/16 [phone]

## 2016-08-13 NOTE — ED Notes (Signed)
Patient's daughter called stating that [facility] from patient's assisted living facility would meet patient out front to take her back upon discharge.

## 2016-08-13 NOTE — ED Notes (Signed)
Pt ambulated in hallway with no distress, pt tolerated ambulation well and was steady on her feet with no c/o dizziness

## 2016-08-13 NOTE — ED Triage Notes (Signed)
Pt is from [facility]- with recent falls and concussions.

## 2016-08-13 NOTE — ED Notes (Addendum)
Pt states that she feels a pressure to the mid lower 1/4 of her sternum and more so pressure to the upper epigastric region of the abd. Pt is no distress able to speak in full sentences.

## 2016-08-13 NOTE — ED Notes (Addendum)
Daughter of pt called this RN and stated that "myself and my sister are both RNs and our mother is the healthiest sick person you'll ever see and we believe she is a hypochondriac so I'm not going to come to the hospital to see her because I believe she is probably fine" the daughter did get this RN her number to call if we find anything serious. Number was written on back on patient's stickers.   Pt was asked if it was okay if I talked to her daughter before this conversation began and pt stated it was okay for this RN to tell her daughter about her.

## 2016-08-13 NOTE — ED Notes (Signed)
Po fluids provided per MD's order

## 2016-08-17 ENCOUNTER — Telehealth: Payer: Self-pay

## 2016-08-17 NOTE — Telephone Encounter (Signed)
This is the response from [place] at [place]:  Autumn Lawson,  She is not eligible to get a new Cpap until 2019 as she just got one from [place] in 2014.  After going over that we scheduled her for 08/13/2016  for transfer of Care for supplies, she called and left a voicemail that she would not be able to make that appointment and I tried calling her most of the day Friday after not being able to reach her thursday to re-schedule.  I received a call earlier but when answered she did not say anything.  I will try to reach out to her again.

## 2016-08-17 NOTE — Telephone Encounter (Signed)
I emailed Heather with [facility] for status up date. They may have contacted the daughter since she is handling the patient's affairs.

## 2016-08-17 NOTE — Telephone Encounter (Signed)
Pt lft vm stating she can't reach [facility] about cpap, she wants a call ASAP [phone]

## 2016-08-18 NOTE — Telephone Encounter (Signed)
I spoke to daughter, she is aware that [facility] has tried to contact patient. I advised her that I will call patient and [facility]. Daughter advised me that patient lives at [facility]. I [employer] manager, [employer] and gave them their contact information to coordinate through patient's nurse.   Patient is aware that I have contacted [facility].

## 2016-08-18 NOTE — Telephone Encounter (Signed)
Pt called in stating she is unable to get ahold of anyone from [employer]. She states she has been trying to get someone for several days now. Please call and advise

## 2016-08-19 NOTE — Telephone Encounter (Signed)
Patient called to advise, she has tried and tried to contact [facility] and they will not call her back. Please call [phone].

## 2016-08-19 NOTE — Telephone Encounter (Signed)
I called patient back and advised her that I have been in touch with [facility] and gave them the contact information to the living facility she is at. Patient voiced understanding.

## 2016-08-20 ENCOUNTER — Telehealth: Payer: Self-pay | Admitting: Neurology

## 2016-08-20 NOTE — Telephone Encounter (Signed)
Pt called in stating she has been trying to call [facility] for several weeks and leaving messages but no on is calling her back. Please call and advise [phone] cell. Pt is living at [facility]

## 2016-08-20 NOTE — Telephone Encounter (Signed)
I have Ambulance person at [employer] for an update.

## 2016-08-20 NOTE — Telephone Encounter (Signed)
I spoke to patient and advised her that [facility] has left a message with Mariann Laster at her facility and they are waiting for a call back.

## 2016-08-20 NOTE — Telephone Encounter (Signed)
I received message back from Heather:  Autumn Lawson,  I have left a Message at her facility for Autumn Lawson to return our call so we may coordinate with them a good date and time to have her meet with the therapist.  Thank you so much!  Vivia Budge, CSR [facility] [address] [place], [place] [address]

## 2016-08-25 ENCOUNTER — Telehealth: Payer: Self-pay | Admitting: Neurology

## 2016-08-25 NOTE — Telephone Encounter (Signed)
Pt called said she was very frustrated with [facility], she said she took her CPAP in and they updated it.  She said [facility] where she lives will not help her with putting the CPAP on tonight because there is not an order. Please call

## 2016-08-25 NOTE — Telephone Encounter (Signed)
Dierdre Searles with [facility] is calling to get an order for a CPAP for the patient faxed to their office at [phone].

## 2016-08-25 NOTE — Telephone Encounter (Signed)
CPAP order faxed as requested. Also asked that [facility] staff assist pt w/ CPAP as needed.

## 2016-09-22 ENCOUNTER — Ambulatory Visit (INDEPENDENT_AMBULATORY_CARE_PROVIDER_SITE_OTHER): Payer: Medicare Other | Admitting: Neurology

## 2016-09-22 ENCOUNTER — Encounter: Payer: Self-pay | Admitting: Neurology

## 2016-09-22 ENCOUNTER — Telehealth: Payer: Self-pay | Admitting: Neurology

## 2016-09-22 VITALS — BP 154/89 | HR 78 | Ht 65.0 in | Wt 140.8 lb

## 2016-09-22 DIAGNOSIS — R413 Other amnesia: Secondary | ICD-10-CM | POA: Diagnosis not present

## 2016-09-22 NOTE — Telephone Encounter (Signed)
Patient reported to Dr. Jannifer Franklin that she has been having trouble tolerating her CPAP. Please reach out to patient for follow-up appointment for her sleep apnea, I do not see an appointment pending.

## 2016-09-22 NOTE — Progress Notes (Signed)
Reason for visit: Memory disturbance  Autumn Lawson is an 75 y.o. female  History of present illness:  Autumn Lawson is a 75 year old right-handed white female with a history of a progressive memory disturbance. The patient has been on Aricept, she could not tolerate Namenda secondary to dizziness. She does not operate a motor vehicle at this time. She has been seen by Dr. Rexene Lawson for sleep apnea, but she has not been able to tolerate the machine. She has not been using the CPAP. She believes that her memory is somewhat better on the Aricept, but her husband indicates that she continues to have significant issues with memory. The patient returns to this office for an evaluation.  Past Medical History:  Diagnosis Date  . ADD (attention deficit disorder with hyperactivity)   . Asystole, 10 sec 06/20/2015  . Depression with anxiety   . Elevated LFTs   . Esophageal reflux   . Gallstone   . Hematoma   . Memory deficits 04/22/2015  . PTSD (post-traumatic stress disorder)   . Skin cancer, basal cell   . Varicose vein of leg    bilateral  . Vertigo   . Vertigo 03/2015    Past Surgical History:  Procedure Laterality Date  . ABDOMINAL HYSTERECTOMY    . BACK SURGERY     rod in back placed  . CATARACT EXTRACTION     bilateral  . CESAREAN SECTION     x4  . CHOLECYSTECTOMY  1981  . EP IMPLANTABLE DEVICE N/A 06/20/2015   Procedure: Pacemaker Implant;  Surgeon: Autumn Lance, MD;  Location: [facility];  Service: Cardiovascular;  Laterality: N/A;  . LIVER SURGERY  1984  . VARICOSE VEIN SURGERY      Family History  Problem Relation Age of Onset  . Leukemia Father   . Alzheimer's disease Mother   . Heart failure Sister   . Healthy Brother   . Healthy Sister   . Healthy Sister   . Healthy Brother   . Prostate cancer Other     paternal [place] grandfather    Social history:  reports that she quit smoking about 40 years ago. She has never used smokeless tobacco. She reports that  she drinks about 4.2 oz of alcohol per week . She reports that she does not use drugs.    Allergies  Allergen Reactions  . Lactose Intolerance (Gi) Other (See Comments)    Gas and bloating  . Levaquin [Levofloxacin In D5w] Swelling and Other (See Comments)    Joint swelling  . Namenda [Memantine Hcl]     Dizzy    Medications:  Prior to Admission medications   Medication Sig Start Date End Date Taking? Authorizing Provider  docusate sodium (COLACE) 100 MG capsule Take 100 mg by mouth at bedtime.   Yes Historical Provider, MD  donepezil (ARICEPT) 10 MG tablet Take 1 tablet (10 mg total) by mouth at bedtime. 04/02/16  Yes Autumn Ducking, MD  ondansetron (ZOFRAN) 4 MG tablet Take 4 mg by mouth every 6 (six) hours as needed for nausea or vomiting.   Yes Historical Provider, MD  polyethylene glycol (MIRALAX / GLYCOLAX) packet Take 17 g by mouth daily as needed for mild constipation.   Yes Historical Provider, MD  sertraline (ZOLOFT) 25 MG tablet Take 50 mg by mouth daily.    Yes Historical Provider, MD  HYDROcodone-acetaminophen (NORCO/VICODIN) 5-325 MG tablet Take 1 tablet by mouth every 4 (four) hours as needed for  moderate pain. Patient not taking: Reported on 09/22/2016 03/03/16   Oswald Hillock, MD    ROS:  Out of a complete 14 system review of symptoms, the patient complains only of the following symptoms, and all other reviewed systems are negative.  Leg swelling Cold intolerance Anxiety Memory loss  Blood pressure (!) 154/89, pulse 78, height 5\' 5"  (1.651 m), weight 140 lb 12 oz (63.8 kg).  Physical Exam  General: The patient is alert and cooperative at the time of the examination.  Skin: 1+ edema at ankles is noted bilaterally.   Neurologic Exam  Mental status: The patient is alert and oriented x 2 at the time of the examination (not oriented to date). The patient has apparent normal recent and remote memory, with an apparently normal attention span and concentration  ability. Mini-Mental Status Examination done today shows a total score 23/30.   Cranial nerves: Facial symmetry is present. Speech is normal, no aphasia or dysarthria is noted. Extraocular movements are full. Visual fields are full.  Motor: The patient has good strength in all 4 extremities.  Sensory examination: Soft touch sensation is symmetric on the face, arms, and legs.  Coordination: The patient has good finger-nose-finger and heel-to-shin bilaterally.  Gait and station: The patient has a normal gait. Tandem gait is normal. Romberg is negative. No drift is seen.  Reflexes: Deep tendon reflexes are symmetric.   Assessment/Plan:  1. Memory disturbance  2. Sleep apnea  The patient will continue the Aricept for now. The patient is interested in hearing about the research for memory disturbances. I will contact our research coordinator regarding this. The patient will need some adjustment on her CPAP mask so that she can tolerate this. I will contact Dr. Rexene Lawson regarding this issue. The patient will follow-up in 6 months.  Autumn Alexanders MD 09/22/2016 1:04 PM  [facility] [address] [place], [place] [address]  Phone [phone] Fax [phone]

## 2016-09-22 NOTE — Telephone Encounter (Signed)
I spoke to pt's husband, Saralyn Pilar, per DPR. I advised him that a follow up with Dr. Rexene Alberts was requested by Dr. Jannifer Franklin regarding pt's cpap.  Pt's husband was agreeable to a follow up appt on Thursday, 09/24/2016 at 2:00pm. I advised pt's husband that pt should arrive at 1:45pm and bring her cpap machine. Pt's husband verbalized understanding of appt date and time.

## 2016-09-22 NOTE — Telephone Encounter (Signed)
I called pt to discuss setting up an appt with Dr. Rexene Alberts. No answer, left a message asking her to call me back.

## 2016-09-23 NOTE — Telephone Encounter (Signed)
Received this notice from [facility]. Pt's cpap therapy report is not available wirelessly.  "She was a Transfer of Care to [place] so we have not received access to her Cpap, it was provided to her by [facility] in 2014.  She will likely need to have a download via SD card. Please let me know anything you need from me."  I reminded pt's husband yesterday to bring pt's cpap to the appt with Dr. Rexene Alberts tomorrow.  I called pt's husband, Autumn Lawson, per DPR. I reminded him again to bring pt's cpap. Pt's husband verbalized understanding to bring cpap and of appt date and time.

## 2016-09-24 ENCOUNTER — Telehealth: Payer: Self-pay

## 2016-09-24 ENCOUNTER — Ambulatory Visit: Payer: Self-pay | Admitting: Neurology

## 2016-09-24 NOTE — Telephone Encounter (Signed)
Pt did not show for their appt with Dr. Athar today.  

## 2016-09-29 ENCOUNTER — Encounter: Payer: Self-pay | Admitting: Neurology

## 2016-10-07 ENCOUNTER — Telehealth: Payer: Self-pay

## 2016-10-07 ENCOUNTER — Encounter: Payer: Self-pay | Admitting: Neurology

## 2016-10-07 NOTE — Telephone Encounter (Signed)
I spoke to pt's husband, Saralyn Pilar, per DPR. I advised him to bring pt's cpap to her appt tomorrow with Dr. Rexene Alberts. Pt's husband verbalized understanding of appt date and time, and to bring cpap. Pt's husband knows to arrive 15 mins early to the appt.

## 2016-10-08 ENCOUNTER — Telehealth: Payer: Self-pay

## 2016-10-08 ENCOUNTER — Encounter: Payer: Self-pay | Admitting: Neurology

## 2016-10-08 ENCOUNTER — Ambulatory Visit (INDEPENDENT_AMBULATORY_CARE_PROVIDER_SITE_OTHER): Payer: Medicare Other | Admitting: Neurology

## 2016-10-08 VITALS — BP 140/84 | HR 84 | Resp 20 | Ht 64.0 in | Wt 141.0 lb

## 2016-10-08 DIAGNOSIS — G4733 Obstructive sleep apnea (adult) (pediatric): Secondary | ICD-10-CM

## 2016-10-08 NOTE — Progress Notes (Signed)
Subjective:    Patient ID: Autumn Lawson is a 75 y.o. female.  HPI     Interim history:   Autumn Lawson is a 75 year old right-handed woman with an underlying medical history of vertigo, history of asystole, status post pacemaker insertion, depression, anxiety, elevated LFTs, reflux disease, memory loss, ADD, PTSD, low back pain, status post multiple surgeries including back surgery with rod in place, cholecystectomy, cataract surgeries bilaterally, liver surgery, varicose vein surgery, hysterectomy, who presents for follow-up consultation of her obstructive sleep apnea, after her recent CPAP titration study. The patient is accompanied by her husband today. I first met her on 06/03/2016 at the request of Dr. Jannifer Franklin, at which time she reported a prior diagnosis of obstructive sleep apnea and prior CPAP therapy but had not been using CPAP for at least a year. She had a recent home sleep test through her retirement community on 05/25/2016, which showed an AHI of 14 per hour, O2 nadir of 77%. I invited her for a full night CPAP titration study. She had this on 07/16/2016. Her sleep efficiency was 77.4%, sleep latency was 66 minutes, REM latency was prolonged at 290.5 minutes. She had an increased percentage of stage II sleep, absence of slow-wave sleep and REM sleep was 11%. She had moderate sleep fragmentation. She had no significant PLMS. She was fitted with a small nasal pillows interface. CPAP was started at 5 cm and titrated to 6 cm. On a pressure of 6 cm, her AHI was 0 per hour. Based on her test results I prescribed CPAP therapy for home use.   Today, 10/09/2016: I reviewed her CPAP compliance data from 09/08/2016 through 10/07/2016 which is a total of 30 days, during which time she used her CPAP only 6 days with percent used days greater than 4 hours and 13%, indicating poor compliance, residual AHI is elevated, leak is high. Pressure at 6 cm. Her compliance from 07/10/2016 through 10/07/2016 which is  a 90 day compliance shows poor compliance with 8 days of usage only. Leak and AHI elevated.  Today, 10/09/2016: She reports not being able to use her machine. She reports that she has not been in touch with a DME company. There are several phone calls documented in her chart. Her husband reports that he has never received a phone call from [facility].   Previously:   06/03/2016: She was previously diagnosed with obstructive sleep apnea and placed on CPAP therapy. She has not been using CPAP for at least a year. We were not able to get a CPAP compliance download from her SD card. She had a sleep study at [facility] on 03/27/2013, I reviewed the results, overall AHI was 12.7 per hour, lowest oxygen saturation was 76%, no PLMS were noted, she had reduced sleep efficiency, reduced REM sleep and reduced slow-wave sleep, increase in spontaneous arousals, she had PVCs on EKG. She had a recent overnight pulse oximetry test or oxygen treatment overnight at [facility]. Her husband lives in their home in [place], [place]. She is in Assisted Living.  She was uncomfortable with the CPAP. She would be willing to try it again and come back for a sleep study.  They have 4 grown children, daughter is the closest to here, in [place]. She drinks a glass of wine per day, is a nonsmoker, drinks 2 cups of coffee per day. She tries to drink enough water and tries to exercise.  Her Past Medical History Is Significant For: Past Medical History:  Diagnosis  Date  . ADD (attention deficit disorder with hyperactivity)   . Asystole, 10 sec 06/20/2015  . Depression with anxiety   . Elevated LFTs   . Esophageal reflux   . Gallstone   . Hematoma   . Memory deficits 04/22/2015  . PTSD (post-traumatic stress disorder)   . Skin cancer, basal cell   . Varicose vein of leg    bilateral  . Vertigo   . Vertigo 03/2015    Her Past Surgical History Is Significant For: Past Surgical History:  Procedure  Laterality Date  . ABDOMINAL HYSTERECTOMY    . BACK SURGERY     rod in back placed  . CATARACT EXTRACTION     bilateral  . CESAREAN SECTION     x4  . CHOLECYSTECTOMY  1981  . EP IMPLANTABLE DEVICE N/A 06/20/2015   Procedure: Pacemaker Implant;  Surgeon: Evans Lance, MD;  Location: [facility];  Service: Cardiovascular;  Laterality: N/A;  . LIVER SURGERY  1984  . VARICOSE VEIN SURGERY      Her Family History Is Significant For: Family History  Problem Relation Age of Onset  . Leukemia Father   . Alzheimer's disease Mother   . Heart failure Sister   . Healthy Brother   . Healthy Sister   . Healthy Sister   . Healthy Brother   . Prostate cancer Other     paternal [place] grandfather    Her Social History Is Significant For: Social History   Social History  . Marital status: Married    Spouse name: N/A  . Number of children: 4  . Years of education: N/A   Occupational History  . retired Therapist, sports    Social History Main Topics  . Smoking status: Former Smoker    Quit date: 05/14/1976  . Smokeless tobacco: Never Used  . Alcohol use 4.2 oz/week    7 Glasses of wine per week     Comment: wine with dinner  . Drug use: No  . Sexual activity: Not Asked   Other Topics Concern  . None   Social History Narrative   Patient drinks 3 cups of caffeine daily.   Patient is right handed.    Her Allergies Are:  Allergies  Allergen Reactions  . Lactose Intolerance (Gi) Other (See Comments)    Gas and bloating  . Levaquin [Levofloxacin In D5w] Swelling and Other (See Comments)    Joint swelling  . Namenda [Memantine Hcl]     Dizzy  :   Her Current Medications Are:  Outpatient Encounter Prescriptions as of 10/08/2016  Medication Sig  . docusate sodium (COLACE) 100 MG capsule Take 100 mg by mouth at bedtime.  . donepezil (ARICEPT) 10 MG tablet Take 1 tablet (10 mg total) by mouth at bedtime.  Marland Kitchen HYDROcodone-acetaminophen (NORCO/VICODIN) 5-325 MG tablet Take 1 tablet by  mouth every 4 (four) hours as needed for moderate pain.  Marland Kitchen ondansetron (ZOFRAN) 4 MG tablet Take 4 mg by mouth every 6 (six) hours as needed for nausea or vomiting.  . polyethylene glycol (MIRALAX / GLYCOLAX) packet Take 17 g by mouth daily as needed for mild constipation.  . sertraline (ZOLOFT) 25 MG tablet Take 50 mg by mouth daily.    No facility-administered encounter medications on file as of 10/08/2016.   :  Review of Systems:  Out of a complete 14 point review of systems, all are reviewed and negative with the exception of these symptoms as listed below:  Review  of Systems  Neurological:       Pt presents today to discuss her cpap. Pt reports that she has been using it compliantly. Pt says that she does not think the settings are correct.    Objective:  Neurologic Exam  Physical Exam Physical Examination:   Vitals:   10/08/16 1310  BP: 140/84  Pulse: 84  Resp: 20   General Examination: The patient is 75 y.o. female in no acute distress. She appears well-developed and well-nourished and well groomed.   HEENT: Normocephalic, atraumatic. Extraocular tracking is good without limitation to gaze excursion or nystagmus noted. Hearing is grossly intact. Face is symmetric with normal facial animation. Speech is clear with no dysarthria noted. There is no hypophonia.   Neurologically:  Mental status: The patient is awake, alert and conversant.   Cranial nerves II - XII are as described above under HEENT exam. Gait, station and balance: She stands easily. No veering to one side is noted. No leaning to one side is noted. Posture is age-appropriate and gait shows normal stride length and normal pace. No problems turning are noted.                Assessment and Plan:   In summary, Autumn Lawson is a 75 year old female with an underlying medical history of vertigo, history of asystole, status post pacemaker insertion, depression, anxiety, elevated LFTs, reflux disease, memory loss,  ADD, PTSD, low back pain, status post multiple surgeries including back surgery with rod in place, cholecystectomy, cataract surgeries bilaterally, liver surgery, varicose vein surgery, hysterectomy, who was previously diagnosed with obstructive sleep apnea and placed on CPAP therapy. She Had a recent home sleep test through her assisted living facility and I ordered a CPAP titration. Based on her test results are prescribed CPAP for home use. Patient has not been compliant with CPAP, she also reports that she has never been shown how to use CPAP even though she has had a machine for years. Her husband reports that he has not been in touch with the DME company, [facility], and that no one has been in touch with him on his cell phone which he has had for the past 15 months. There are several documented phone conversations including a phone call advising patients daughter of the CPAP titration results and that the DME company has been trying to get in touch. We also got in touch with her assisted living facility but at this moment she is not using her CPAP. I'm not sure where the communication drop/gap occurred. At this juncture I suggested we get in touch with the DME company again and we also provided there phone numbers to the patient and her husband so they can be in touch as well. She will need reeducation as to how to use CPAP, clean the machine, change the mask and filters etc. We suggested a one-month follow-up with one of our nurse practitioners, to make sure things are going the right direction and compliance has improved. I spent 14 minutes in total face-to-face time with the patient, more than 50% of which was spent in counseling and coordination of care, reviewing test results, reviewing medication and discussing or reviewing the diagnosis of OSA, its prognosis and treatment options.

## 2016-10-08 NOTE — Telephone Encounter (Signed)
I called [facility], spoke with Lattie Haw, and gave her husband's cell phone number of [phone]. Lattie Haw reports to me that they have contacted pt and husband and daughter and nursing home several times but will call pt's husband again today.

## 2016-10-08 NOTE — Patient Instructions (Signed)
We will have [facility] get in touch with you again and have them go over the CPAP usage and further education.   We will set up a follow up in about 4 weeks to make sure, things are going better with the CPAP.

## 2016-10-27 ENCOUNTER — Ambulatory Visit (INDEPENDENT_AMBULATORY_CARE_PROVIDER_SITE_OTHER): Payer: Medicare Other | Admitting: *Deleted

## 2016-10-27 DIAGNOSIS — I495 Sick sinus syndrome: Secondary | ICD-10-CM

## 2016-10-27 NOTE — Progress Notes (Signed)
Remote pacemaker transmission.   

## 2016-10-31 LAB — CUP PACEART REMOTE DEVICE CHECK
Battery Voltage: 2.78 V
Brady Statistic AP VS Percent: 42 %
Brady Statistic AS VS Percent: 58 %
Implantable Lead Implant Date: 20160908
Lead Channel Impedance Value: 544 Ohm
Lead Channel Impedance Value: 567 Ohm
Lead Channel Pacing Threshold Amplitude: 0.625 V
Lead Channel Pacing Threshold Pulse Width: 0.4 ms
Lead Channel Sensing Intrinsic Amplitude: 5.6 mV
Lead Channel Setting Pacing Amplitude: 1.5 V
Lead Channel Setting Sensing Sensitivity: 2.8 mV
MDC IDC LEAD IMPLANT DT: 20160908
MDC IDC LEAD LOCATION: 753859
MDC IDC LEAD LOCATION: 753860
MDC IDC MSMT BATTERY IMPEDANCE: 110 Ohm
MDC IDC MSMT BATTERY REMAINING LONGEVITY: 168 mo
MDC IDC MSMT LEADCHNL RA PACING THRESHOLD AMPLITUDE: 0.5 V
MDC IDC MSMT LEADCHNL RA PACING THRESHOLD PULSEWIDTH: 0.4 ms
MDC IDC MSMT LEADCHNL RA SENSING INTR AMPL: 2.8 mV
MDC IDC PG IMPLANT DT: 20160908
MDC IDC SESS DTM: 20180116143807
MDC IDC SET LEADCHNL RV PACING AMPLITUDE: 2 V
MDC IDC SET LEADCHNL RV PACING PULSEWIDTH: 0.4 ms
MDC IDC STAT BRADY AP VP PERCENT: 0 %
MDC IDC STAT BRADY AS VP PERCENT: 0 %

## 2016-11-04 ENCOUNTER — Ambulatory Visit: Payer: Medicare Other | Admitting: Nurse Practitioner

## 2016-11-04 ENCOUNTER — Encounter: Payer: Self-pay | Admitting: Cardiology

## 2016-11-16 ENCOUNTER — Ambulatory Visit: Payer: Medicare Other | Admitting: Neurology

## 2016-12-30 ENCOUNTER — Telehealth: Payer: Self-pay | Admitting: Neurology

## 2016-12-30 NOTE — Telephone Encounter (Signed)
Note from [facility] states that the patient is "refusing to use CPAP and does not want as PRN. Resident has disconnected and wants removed from her room." Order signed by Dr. Rexene Alberts and faxed back in.

## 2016-12-30 NOTE — Telephone Encounter (Signed)
[facility] requested form for D/C CPAP. Filled out form for D/C CPAP d/t non-compliance.

## 2017-01-26 ENCOUNTER — Telehealth: Payer: Self-pay | Admitting: Cardiology

## 2017-01-26 ENCOUNTER — Ambulatory Visit (INDEPENDENT_AMBULATORY_CARE_PROVIDER_SITE_OTHER): Payer: Medicare Other | Admitting: *Deleted

## 2017-01-26 ENCOUNTER — Telehealth: Payer: Self-pay | Admitting: *Deleted

## 2017-01-26 DIAGNOSIS — I495 Sick sinus syndrome: Secondary | ICD-10-CM | POA: Diagnosis not present

## 2017-01-26 NOTE — Telephone Encounter (Signed)
Autumn Lawson calling c/o being able to feel device in chest more readily than before. She c/o of device site tenderness- no redness or swelling. She says that this has been happening for "quite some time." Scheduled for device site check at 3pm 02/10/17. She will send remote today as scheduled.

## 2017-01-26 NOTE — Telephone Encounter (Signed)
Spoke with pt and reminded pt of remote transmission that is due today. Pt verbalized understanding.   

## 2017-01-27 NOTE — Progress Notes (Signed)
Remote pacemaker transmission.   

## 2017-01-28 LAB — CUP PACEART REMOTE DEVICE CHECK
Battery Impedance: 134 Ohm
Brady Statistic AP VP Percent: 0 %
Brady Statistic AS VP Percent: 0 %
Implantable Lead Implant Date: 20160908
Implantable Lead Location: 753859
Implantable Lead Model: 5076
Implantable Lead Model: 5076
Implantable Pulse Generator Implant Date: 20160908
Lead Channel Impedance Value: 552 Ohm
Lead Channel Impedance Value: 554 Ohm
Lead Channel Pacing Threshold Amplitude: 0.5 V
Lead Channel Pacing Threshold Pulse Width: 0.4 ms
Lead Channel Setting Pacing Amplitude: 2 V
Lead Channel Setting Sensing Sensitivity: 2 mV
MDC IDC LEAD IMPLANT DT: 20160908
MDC IDC LEAD LOCATION: 753860
MDC IDC MSMT BATTERY REMAINING LONGEVITY: 162 mo
MDC IDC MSMT BATTERY VOLTAGE: 2.78 V
MDC IDC MSMT LEADCHNL RV PACING THRESHOLD AMPLITUDE: 0.625 V
MDC IDC MSMT LEADCHNL RV PACING THRESHOLD PULSEWIDTH: 0.4 ms
MDC IDC SESS DTM: 20180418170402
MDC IDC SET LEADCHNL RA PACING AMPLITUDE: 1.5 V
MDC IDC SET LEADCHNL RV PACING PULSEWIDTH: 0.4 ms
MDC IDC STAT BRADY AP VS PERCENT: 40 %
MDC IDC STAT BRADY AS VS PERCENT: 59 %

## 2017-01-29 ENCOUNTER — Encounter: Payer: Self-pay | Admitting: Cardiology

## 2017-02-23 ENCOUNTER — Encounter: Payer: Self-pay | Admitting: Nurse Practitioner

## 2017-02-23 ENCOUNTER — Ambulatory Visit (INDEPENDENT_AMBULATORY_CARE_PROVIDER_SITE_OTHER): Payer: Medicare Other | Admitting: Nurse Practitioner

## 2017-02-23 VITALS — BP 124/66 | HR 80 | Ht 64.0 in | Wt 140.0 lb

## 2017-02-23 DIAGNOSIS — R413 Other amnesia: Secondary | ICD-10-CM | POA: Diagnosis not present

## 2017-02-23 DIAGNOSIS — G4733 Obstructive sleep apnea (adult) (pediatric): Secondary | ICD-10-CM | POA: Diagnosis not present

## 2017-02-23 MED ORDER — DONEPEZIL HCL 10 MG PO TABS: 10.0000 mg | ORAL_TABLET | Freq: Every day | ORAL | 3 refills | Status: DC

## 2017-02-23 NOTE — Progress Notes (Signed)
I have read the note, and I agree with the clinical assessment and plan.  Daemyn Gariepy KEITH   

## 2017-02-23 NOTE — Progress Notes (Signed)
[facility]  PATIENT: Autumn Lawson DOB: [birth year]   REASON FOR VISIT: Follow-up for memory loss HISTORY FROM: Patient and daughter    HISTORY OF PRESENT ILLNESS:Ms. Mccain is a 76 year old right-handed white female with a history of a progressive memory disturbance. The patient has been on Aricept, she could not tolerate Namenda secondary to dizziness. She has been residing in assisted living for over a year. She participates in some of the activities there. She does not operate a motor vehicle at this time. She has been seen by Dr. Rexene Alberts for sleep apnea, but she has not been able to tolerate the machine. She has not been using the CPAP. She believes that her memory is somewhat better on the Aricept, but her daughter indicates that she continues to have  issues with memory. The patient returns to this office for an evaluation.   REVIEW OF SYSTEMS: Full 14 system review of systems performed and notable only for those listed, all others are neg:  Constitutional: neg  Cardiovascular: neg Ear/Nose/Throat: neg  Skin: neg Eyes: Decreased vision, dry eyes Respiratory: neg Gastroitestinal: neg  Hematology/Lymphatic: neg  Endocrine: neg Musculoskeletal:neg Allergy/Immunology: neg Neurological: Memory issues  Psychiatric: neg Sleep : neg   ALLERGIES: Allergies  Allergen Reactions  . Lactose Intolerance (Gi) Other (See Comments)    Gas and bloating  . Levaquin [Levofloxacin In D5w] Swelling and Other (See Comments)    Joint swelling  . Pearlean Brownie Hcl]     Dizzy    HOME MEDICATIONS: Outpatient Medications Prior to Visit  Medication Sig Dispense Refill  . docusate sodium (COLACE) 100 MG capsule Take 100 mg by mouth at bedtime.    . donepezil (ARICEPT) 10 MG tablet Take 1 tablet (10 mg total) by mouth at bedtime. 90 tablet 3  . HYDROcodone-acetaminophen (NORCO/VICODIN) 5-325 MG tablet Take 1 tablet by mouth every 4 (four) hours as needed for moderate  pain. 30 tablet 0  . ondansetron (ZOFRAN) 4 MG tablet Take 4 mg by mouth every 6 (six) hours as needed for nausea or vomiting.    . polyethylene glycol (MIRALAX / GLYCOLAX) packet Take 17 g by mouth daily as needed for mild constipation.    . sertraline (ZOLOFT) 25 MG tablet Take 50 mg by mouth daily.      No facility-administered medications prior to visit.     PAST MEDICAL HISTORY: Past Medical History:  Diagnosis Date  . ADD (attention deficit disorder with hyperactivity)   . Asystole, 10 sec 06/20/2015  . Depression with anxiety   . Elevated LFTs   . Esophageal reflux   . Gallstone   . Hematoma   . Memory deficits 04/22/2015  . PTSD (post-traumatic stress disorder)   . Skin cancer, basal cell   . Varicose vein of leg    bilateral  . Vertigo   . Vertigo 03/2015    PAST SURGICAL HISTORY: Past Surgical History:  Procedure Laterality Date  . ABDOMINAL HYSTERECTOMY    . BACK SURGERY     rod in back placed  . CATARACT EXTRACTION     bilateral  . CESAREAN SECTION     x4  . CHOLECYSTECTOMY  1981  . EP IMPLANTABLE DEVICE N/A 06/20/2015   Procedure: Pacemaker Implant;  Surgeon: Evans Lance, MD;  Location: [facility];  Service: Cardiovascular;  Laterality: N/A;  . LIVER SURGERY  1984  . VARICOSE VEIN SURGERY      FAMILY HISTORY: Family History  Problem Relation Age of  Onset  . Leukemia Father   . Alzheimer's disease Mother   . Heart failure Sister   . Healthy Brother   . Healthy Sister   . Healthy Sister   . Healthy Brother   . Prostate cancer Other        paternal [place] grandfather    SOCIAL HISTORY: Social History   Social History  . Marital status: Married    Spouse name: N/A  . Number of children: 4  . Years of education: N/A   Occupational History  . retired Therapist, sports    Social History Main Topics  . Smoking status: Former Smoker    Quit date: 05/14/1976  . Smokeless tobacco: Never Used  . Alcohol use 4.2 oz/week    7 Glasses of wine per week      Comment: wine with dinner  . Drug use: No  . Sexual activity: Not on file   Other Topics Concern  . Not on file   Social History Narrative   Patient drinks 3 cups of caffeine daily.   Patient is right handed.     PHYSICAL EXAM  Vitals:   02/23/17 1416  BP: 124/66  Pulse: 80  Weight: 140 lb (63.5 kg)  Height: 5\' 4"  (1.626 m)   Body mass index is 24.03 kg/m.  Generalized: Well developed, in no acute distress  Head: normocephalic and atraumatic,. Oropharynx benign  Neck: Supple,  Skin 1+ edema at the ankles bilaterally     Neurological examination   Mentation: Alert . AFT 5. Clock drawing 4/4 Follows all commands speech and language fluent.  MMSE - Mini Mental State Exam 02/23/2017 09/22/2016 05/12/2016  Orientation to time 2 3 2   Orientation to Place 3 5 5   Registration 3 3 3   Attention/ Calculation 5 2 5   Recall 1 3 1   Language- name 2 objects 2 2 2   Language- repeat 0 0 1  Language- follow 3 step command 3 2 3   Language- read & follow direction 1 1 1   Write a sentence 1 1 1   Copy design 1 1 1   Total score 22 23 25   .   Cranial nerve II-XII: Pupils were equal round reactive to light extraocular movements were full, visual field were full on confrontational test. Facial sensation and strength were normal. hearing was intact to finger rubbing bilaterally. Uvula tongue midline. head turning and shoulder shrug were normal and symmetric.Tongue protrusion into cheek strength was normal. Motor: normal bulk and tone, full strength in the BUE, BLE, Sensory: normal and symmetric to light touch, pinprick, and  Vibration in the upper and lower extremities Coordination: finger-nose-finger, heel-to-shin bilaterally, no dysmetria Reflexes: Symmetric upper and lower plantar responses were flexor bilaterally. Gait and Station: Rising up from seated position without assistance, normal stance,  moderate stride, good arm swing, smooth turning, able to perform tiptoe, and heel walking  without difficulty. Tandem gait is steady  DIAGNOSTIC DATA (LABS, IMAGING, TESTING) - I reviewed patient records, labs, notes, testing and imaging myself where available.  Lab Results  Component Value Date   WBC 6.2 08/13/2016   HGB 13.5 08/13/2016   HCT 40.5 08/13/2016   MCV 99.0 08/13/2016   PLT 203 08/13/2016      Component Value Date/Time   NA 141 08/13/2016 1053   K 3.9 08/13/2016 1053   CL 109 08/13/2016 1053   CO2 26 08/13/2016 1053   GLUCOSE 93 08/13/2016 1053   BUN 12 08/13/2016 1053   CREATININE 0.59 08/13/2016 1053  CALCIUM 9.2 08/13/2016 1053   PROT 6.1 (L) 08/13/2016 1235   ALBUMIN 3.7 08/13/2016 1235   AST 24 08/13/2016 1235   ALT 28 08/13/2016 1235   ALKPHOS 80 08/13/2016 1235   BILITOT 0.3 08/13/2016 1235   GFRNONAA >60 08/13/2016 1053   GFRAA >60 08/13/2016 1053     Lab Results  Component Value Date   VOPFYTWK46 286 03/22/2015   Lab Results  Component Value Date   TSH 1.406 03/22/2015      ASSESSMENT AND PLAN  76 y.o. 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); PTSD (post-traumatic stress disorder); And obstructive sleep apnea currently not using CPAP   PLAN: Continue Aricept for memory will refill Given the book through the seasons activity for memory challenged adults Recommend that she wear her CPAP as directed F/U in 6 months I spent 33min  in total face to face time with the patient more than 50% of which was spent counseling and coordination of care, reviewing test results reviewing medications and discussing and reviewing the diagnosis of progressive memory disorder and obstructive sleep apnea and the importance of being compliant with her CPAP. Also patient encouraged to be more involved in activities at assisted living  and exercise by walking at least daily and further treatment options. , Symptoms of untreated OSA include daytime sleepiness, memory problems, mood  irritability and mood disorder such as depression and anxiety, lack of energy, as well as recurrent headaches, especially morning headaches. Dennie Bible, [facility], [facility], APRN  [facility] [address] [place], [place] [address] [phone]

## 2017-02-23 NOTE — Patient Instructions (Signed)
Continue Aricept for memory will refill Given the book through the seasons activity for memory challenged adults Recommend that she wear her CPAP F/U in 6 months

## 2017-02-24 NOTE — Progress Notes (Signed)
Fax confirmation received for donezepil [phone].

## 2017-03-03 ENCOUNTER — Telehealth: Payer: Self-pay | Admitting: Neurology

## 2017-03-03 NOTE — Telephone Encounter (Signed)
Pt is requesting a call back re: what steps need to be taken for her to be allowed to return home.  Pt said she has been at the [facility] for a year and feels great.  She states she is ready to get on with the rest of her life and would like to know if Dr Jannifer Franklin needs to see or 1st before such a process can begin(regarding her returning home). Please call

## 2017-03-03 NOTE — Telephone Encounter (Signed)
The decision on whether to stay in an extended care facility or not is mainly based on what support she can get from friends and family outside of her extended care facility. If she is to go home, she will need help with medications, appointments, operating motor vehicle.  This discussion is one she needs to have with her family, not with her doctor. The need for supervision is likely to increase over time, not decrease.

## 2017-03-23 ENCOUNTER — Ambulatory Visit: Payer: Medicare Other | Admitting: Nurse Practitioner

## 2017-04-05 ENCOUNTER — Ambulatory Visit: Payer: Medicare Other | Admitting: Emergency Medicine

## 2017-04-28 ENCOUNTER — Encounter: Payer: Medicare Other | Admitting: *Deleted

## 2017-04-28 ENCOUNTER — Telehealth: Payer: Self-pay | Admitting: Cardiology

## 2017-04-28 NOTE — Telephone Encounter (Signed)
Confirmed remote transmission w/ pt daughter.   

## 2017-04-29 ENCOUNTER — Encounter: Payer: Self-pay | Admitting: Cardiology
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
## 2017-05-20 ENCOUNTER — Encounter: Payer: Self-pay | Admitting: Gynecology

## 2017-05-20 ENCOUNTER — Ambulatory Visit (INDEPENDENT_AMBULATORY_CARE_PROVIDER_SITE_OTHER): Payer: Medicare Other | Admitting: Gynecology

## 2017-05-20 VITALS — BP 118/76 | Ht 63.0 in | Wt 140.0 lb

## 2017-05-20 DIAGNOSIS — Z79890 Hormone replacement therapy (postmenopausal): Secondary | ICD-10-CM | POA: Diagnosis not present

## 2017-05-20 DIAGNOSIS — N952 Postmenopausal atrophic vaginitis: Secondary | ICD-10-CM

## 2017-05-20 DIAGNOSIS — Z01411 Encounter for gynecological examination (general) (routine) with abnormal findings: Secondary | ICD-10-CM | POA: Diagnosis not present

## 2017-05-20 NOTE — Patient Instructions (Addendum)
Follow up for your bone density as scheduled.  Schedule your mammogram.  Follow up in one year for annual exam, sooner if any issues.

## 2017-05-20 NOTE — Progress Notes (Signed)
Autumn Lawson 05/01/1941 [id number]        76 y.o.  [id number] for breast and pelvic exam. Patient also wants to discuss HRT. Status post TAH/BSO in the past for leiomyoma and bleeding. Had been on HRT for number of years. Most recently had been on a bioequivalent mixture to include estrogen, progesterone and DHEA. Ran out 6 months to a year ago has been off of it since. Does note some decreased mood and wants to talk about starting HRT for this indication. Is not having significant hot flushes or night sweats. Is not having vaginal dryness fatigue or muscle aching. Is on Aricept with having some memory issues accompanied by her daughter today..  Past medical history,surgical history, problem list, medications, allergies, family history and social history were all reviewed and documented as reviewed in the EPIC chart.  ROS:  Performed with pertinent positives and negatives included in the history, assessment and plan.   Additional significant findings :  None  Exam: Gilman Schmidt  assistant Vitals:   05/20/17 1143  BP: 118/76  Weight: 140 lb (63.5 kg)  Height: 5\' 3"  (1.6 m)   Body mass index is 24.8 kg/m.  General appearance:  Normal affect, orientation and appearance. Skin: Grossly normal HEENT: Without gross lesions.  No cervical or supraclavicular adenopathy. Thyroid normal.  Lungs:  Clear without wheezing, rales or rhonchi Cardiac: RR, without RMG Abdominal:  Soft, nontender, without masses, guarding, rebound, organomegaly or hernia Breasts:  Examined lying and sitting without masses, retractions, discharge or axillary adenopathy. Pelvic:  Ext, BUS, Vagina: With atrophic changes  Adnexa: Without masses or tenderness    Anus and perineum: Normal   Rectovaginal: Normal sphincter tone without palpated masses or tenderness.  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   Assessment/Plan:  76 y.o. [id number] female for breast and pelvic exam.    1. Postmenopausal/HRT.  Had been on HRT for number years following her hysterectomy BSO. Most recently had been on a bioequivalent regiment stopped approximate 6 months to year ago. Her only menopausal complaints at this point is decreased mood. Not having hot flushes, night sweats, vaginal dryness or other more classic hypoestrogenic symptoms. I reviewed the whole issue of HRT to include a number of studies. Risks/benefits reviewed to include symptom relief and possible cardiovascular and bone health particular when started early versus risks to include thrombosis such as stroke heart attack DVT and breast cancer issue. Given her age a realistic risk of thrombosis/stroke discussed. Also has numerous varicosities in the lower extremity which I feel would increased risk of DVT. Lastly she is on Aricept having some memory issues. The various studies involving HRT and memory were discussed. Initially summons implied improvement while others implied worsening of dementia. After a lengthy discussion in considering all the factors we both agree not to start HRT. I did suggest she follow up with her primary physician who prescribes the Zoloft and ask about adjusting the dosage to see if this does not help with her mood. If changes in her mood are related to stopping the HRT previously then the further we get away from this then she should start to see improvement. 2. Mammography 07/2015. Recommended patient repeat mammogram now and she agrees to call and schedule. Breast exam normal today. 3. DEXA a number of years ago. Recommended scheduling a baseline DEXA. Discussed benefits of treatment if indeed she has osteoporosis or elevated FRAX. Patient agrees with this and we will schedule this for her. 4. Pap  smear reportedly done 2 years ago. No history of abnormal Pap smears. Status post TAH/BSO in the past for leiomyoma. Per current screening guidelines based on age and hysterectomy history we both agree to stop  screening in no Pap smear done today. 5. Colonoscopy. Unclear when her last colonoscopy was.  Discussed with her daughter and the patient that they should discuss this with her primary physician to decide when or if they want to continue with screening colonoscopy. The going to address this with him next time they see him. 6. Health maintenance. No routine lab work done as patient reports this done elsewhere. Follow up for the bone density. Follow up in one year for exam.  Additional time in excess of her breast and pelvic exam was spent in direct face to face counseling and coordination of care in regards to her HRT discussion.   Anastasio Auerbach MD, 3:05 PM 05/20/2017

## 2017-05-31 ENCOUNTER — Other Ambulatory Visit: Payer: Self-pay | Admitting: Gynecology

## 2017-05-31 DIAGNOSIS — Z780 Asymptomatic menopausal state: Secondary | ICD-10-CM

## 2018-02-28 ENCOUNTER — Ambulatory Visit: Payer: Medicare Other | Admitting: Nurse Practitioner

## 2018-03-24 ENCOUNTER — Telehealth: Payer: Self-pay | Admitting: *Deleted

## 2018-03-24 NOTE — Telephone Encounter (Signed)
Called patient's husband on DPR to inquire if patient has been using her CPAP; there is not an updated report in [facility]. He stated she has not been using CPAP in a long time.  She still lives in assisted living; he has apartment in [place]. He will be bringing her to her FU. He stated her memory is much worse, and he wants Dr Jannifer Franklin to see her. This RN advised she is seeing Daun Peacock, NP who saw her last year.  The husband stated he is "going to present that" to the NP, so I can let her know. This RN stated will let NP. Advised they arrive 30 minutes early. He verbalized understanding.

## 2018-03-24 NOTE — Progress Notes (Signed)
[facility]  PATIENT: Autumn Lawson DOB: [birth year]   REASON FOR VISIT: Follow-up for memory loss HISTORY FROM: Patient and husband Autumn Lawson    HISTORY OF PRESENT ILLNESS:[name] is a 77 year old right-handed white female with a history of a progressive memory disturbance. The patient has been on Aricept, she could not tolerate Namenda secondary to dizziness. She has been residing in assisted living for two years.  She participates in some of the activities there. She does not operate a motor vehicle at this time.  She goes to the dining room for her meals.  She has been seen by Dr. Rexene Lawson for sleep apnea, but she has not been able to tolerate the machine. She has not been using the CPAP. She believes that her memory is about the same .The husband states she has word finding difficulty at times.  The patient returns to this office for an evaluation.   REVIEW OF SYSTEMS: Full 14 system review of systems performed and notable only for those listed, all others are neg:  Constitutional: neg  Cardiovascular: neg Ear/Nose/Throat: neg  Skin: neg Eyes: Decreased vision, dry eyes Respiratory: neg Gastroitestinal: neg  Hematology/Lymphatic: neg  Endocrine: Intolerance to cold Musculoskeletal:neg Allergy/Immunology: Food allergies Neurological: Memory issues  Psychiatric: Anxiety Sleep : neg   ALLERGIES: Allergies  Allergen Reactions  . Lactose Intolerance (Gi) Other (See Comments)    Gas and bloating  . Levaquin [Levofloxacin In D5w] Swelling and Other (See Comments)    Joint swelling, flu- like symptoms  . Pearlean Brownie Hcl]     Dizzy    HOME MEDICATIONS: Outpatient Medications Prior to Visit  Medication Sig Dispense Refill  . docusate sodium (COLACE) 100 MG capsule Take 100 mg by mouth at bedtime.    . donepezil (ARICEPT) 10 MG tablet Take 1 tablet (10 mg total) by mouth at bedtime. 90 tablet 3  . polyethylene glycol (MIRALAX / GLYCOLAX) packet Take  17 g by mouth daily as needed for mild constipation.    . sertraline (ZOLOFT) 25 MG tablet Take 50 mg by mouth daily.      No facility-administered medications prior to visit.     PAST MEDICAL HISTORY: Past Medical History:  Diagnosis Date  . ADD (attention deficit disorder with hyperactivity)   . Asystole, 10 sec 06/20/2015  . Depression with anxiety   . Elevated LFTs   . Esophageal reflux   . Gallstone   . Hematoma   . Memory deficits 04/22/2015  . PTSD (post-traumatic stress disorder)   . Skin cancer, basal cell   . Varicose vein of leg    bilateral  . Vertigo   . Vertigo 03/2015    PAST SURGICAL HISTORY: Past Surgical History:  Procedure Laterality Date  . ABDOMINAL HYSTERECTOMY     TAH BSO  . BACK SURGERY     rod in back placed  . CATARACT EXTRACTION     bilateral  . CESAREAN SECTION     x4  . CHOLECYSTECTOMY  1981  . EP IMPLANTABLE DEVICE N/A 06/20/2015   Procedure: Pacemaker Implant;  Surgeon: Autumn Lance, MD;  Location: [facility];  Service: Cardiovascular;  Laterality: N/A;  . LIVER SURGERY  1984  . VARICOSE VEIN SURGERY      FAMILY HISTORY: Family History  Problem Relation Age of Onset  . Leukemia Father   . Alzheimer's disease Mother   . Heart failure Sister   . Healthy Brother   . Healthy Sister   .  Healthy Sister   . Healthy Brother   . Prostate cancer Other        paternal [place] grandfather    SOCIAL HISTORY: Social History   Socioeconomic History  . Marital status: Married    Spouse name: Not on file  . Number of children: 4  . Years of education: college  . Highest education level: Not on file  Occupational History  . Occupation: retired rn    Comment: RN  Social Needs  . Financial resource strain: Not on file  . Food insecurity:    Worry: Not on file    Inability: Not on file  . Transportation needs:    Medical: Not on file    Non-medical: Not on file  Tobacco Use  . Smoking status: Former Smoker    Last attempt to  quit: 05/14/1976    Years since quitting: 41.8  . Smokeless tobacco: Never Used  Substance and Sexual Activity  . Alcohol use: Yes    Alcohol/week: 4.2 oz    Types: 7 Glasses of wine per week    Comment: wine with dinner  . Drug use: No  . Sexual activity: Not Currently  Lifestyle  . Physical activity:    Days per week: Not on file    Minutes per session: Not on file  . Stress: Not on file  Relationships  . Social connections:    Talks on phone: Not on file    Gets together: Not on file    Attends religious service: Not on file    Active member of club or organization: Not on file    Attends meetings of clubs or organizations: Not on file    Relationship status: Not on file  . Intimate partner violence:    Fear of current or ex partner: Not on file    Emotionally abused: Not on file    Physically abused: Not on file    Forced sexual activity: Not on file  Other Topics Concern  . Not on file  Social History Narrative   Patient drinks 3 cups of caffeine daily.   Patient is right handed.     PHYSICAL EXAM  Vitals:   03/25/18 0857  BP: 128/79  Pulse: 64  Weight: 141 lb (64 kg)  Height: 5\' 3"  (1.6 m)   Body mass index is 24.98 kg/m.  Generalized: Well developed, in no acute distress  Head: normocephalic and atraumatic,. Oropharynx benign  Neck: Supple,  Skin no edema     Neurological examination   Mentation: Alert . MMSE - Mini Mental State Exam 03/25/2018 02/23/2017 09/22/2016  Orientation to time 0 2 3  Orientation to Place 3 3 5   Registration 3 3 3   Attention/ Calculation 3 5 2   Recall 2 1 3   Language- name 2 objects 2 2 2   Language- repeat 0 0 0  Language- follow 3 step command 3 3 2   Language- read & follow direction 1 1 1   Write a sentence 1 1 1   Copy design 1 1 1   Total score 19 22 23    AFT 6. Clock drawing 4/4 Follows all commands speech and language fluent.  .   Cranial nerve II-XII: Pupils were equal round reactive to light extraocular  movements were full, visual field were full on confrontational test. Facial sensation and strength were normal. hearing was intact to finger rubbing bilaterally. Uvula tongue midline. head turning and shoulder shrug were normal and symmetric.Tongue protrusion into cheek strength was normal. Motor: normal  bulk and tone, full strength in the BUE, BLE, Sensory: normal and symmetric to light touch, pinprick, and  Vibration in the upper and lower extremities Coordination: finger-nose-finger, heel-to-shin bilaterally, no dysmetria Reflexes: Symmetric upper and lower plantar responses were flexor bilaterally. Gait and Station: Rising up from seated position without assistance, normal stance,  moderate stride, good arm swing, smooth turning, able to perform tiptoe, and heel walking without difficulty. Tandem gait is steady, no assistive device  DIAGNOSTIC DATA (LABS, IMAGING, TESTING) - I reviewed patient records, labs, notes, testing and imaging myself where available.  Lab Results  Component Value Date   WBC 6.2 08/13/2016   HGB 13.5 08/13/2016   HCT 40.5 08/13/2016   MCV 99.0 08/13/2016   PLT 203 08/13/2016      Component Value Date/Time   NA 141 08/13/2016 1053   K 3.9 08/13/2016 1053   CL 109 08/13/2016 1053   CO2 26 08/13/2016 1053   GLUCOSE 93 08/13/2016 1053   BUN 12 08/13/2016 1053   CREATININE 0.59 08/13/2016 1053   CALCIUM 9.2 08/13/2016 1053   PROT 6.1 (L) 08/13/2016 1235   ALBUMIN 3.7 08/13/2016 1235   AST 24 08/13/2016 1235   ALT 28 08/13/2016 1235   ALKPHOS 80 08/13/2016 1235   BILITOT 0.3 08/13/2016 1235   GFRNONAA >60 08/13/2016 1053   GFRAA >60 08/13/2016 1053     Lab Results  Component Value Date   PPIRJJOA41 660 03/22/2015   Lab Results  Component Value Date   TSH 1.406 03/22/2015      ASSESSMENT AND PLAN  77 y.o. 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  Patient had side effects to Namenda(dizziness)   PLAN: Continue Aricept for memory  Get regular exercise  Participate in group activities Eat healthy diet  F/U in 6 months I spent 24min  in total face to face time with the patient /husbandmore than 50% of which was spent counseling and coordination of care, reviewing test results reviewing medications and discussing and reviewing the diagnosis of progressive memory disorder and obstructive sleep apnea and the importance of being compliant with her CPAP. Also patient encouraged to be more involved in activities at assisted living  and exercise by walking at least daily Symptoms of untreated OSA include daytime sleepiness, memory problems, mood irritability and mood disorder such as depression and anxiety, lack of energy, as well as recurrent headaches, especially morning headaches. Dennie Bible, [facility], [facility], APRN  [facility] [address] [place], [place] [address] [phone]

## 2018-03-25 ENCOUNTER — Encounter: Payer: Self-pay | Admitting: Nurse Practitioner

## 2018-03-25 ENCOUNTER — Ambulatory Visit (INDEPENDENT_AMBULATORY_CARE_PROVIDER_SITE_OTHER): Payer: Medicare Other | Admitting: Nurse Practitioner

## 2018-03-25 ENCOUNTER — Encounter

## 2018-03-25 VITALS — BP 128/79 | HR 64 | Ht 63.0 in | Wt 141.0 lb

## 2018-03-25 DIAGNOSIS — R413 Other amnesia: Secondary | ICD-10-CM

## 2018-03-25 DIAGNOSIS — G4733 Obstructive sleep apnea (adult) (pediatric): Secondary | ICD-10-CM

## 2018-03-25 MED ORDER — DONEPEZIL HCL 10 MG PO TABS: 10.0000 mg | ORAL_TABLET | Freq: Every day | ORAL | 3 refills | Status: DC

## 2018-03-25 NOTE — Progress Notes (Signed)
Aricept refill Rx successfully faxed to [facility] [place].

## 2018-03-25 NOTE — Patient Instructions (Signed)
Continue Aricept for memory  Get regular exercise  Participate in group activities Eat healthy diet  F/U in 6 months

## 2018-04-21 ENCOUNTER — Telehealth: Payer: Self-pay | Admitting: Nurse Practitioner

## 2018-04-21 NOTE — Telephone Encounter (Signed)
Unfortunately there is not a magic cocktail and we avoid sleep medications as they can cause more confusion and put the patient at risk for falls if they wake up during the night.  The patient is on Zoloft by another provider.  If you do not feel that is working you can check with that provider to get a change

## 2018-04-21 NOTE — Telephone Encounter (Signed)
Pts daughter([name]) called requesting a call. Stating the pt has been depressed for quite sometime but seems to be worsening. Stating the pt doesn't sleep at night and has been "weeping" more. Autumn Lawson requesting a call to discuss

## 2018-04-21 NOTE — Telephone Encounter (Addendum)
Spoke with daughter and advised her of NP's message. Jiles Garter asked what to do, stated her mother is staying up late at night watching TV and "banging around". This RN advised that family needs to keep her up and active during the day so that hopefully she'll sleep better at night, and try to get her days/nights straightened out. Advised that medications to help her sleep or stabilize her mood will just make her more apt for confusion and possible falls. Jiles Garter verbalized understanding, appreciation of call.

## 2018-04-21 NOTE — Telephone Encounter (Signed)
Called daughter, on DPR who stated her mother wakes up frequently at night. She understands that the progression of dementia can cause patient's to confuse days and nights, wake and walk around at night. She would like to know if a mood stabilizer or something to help her sleep could be prescribed. She stated she would like her to be more content.  She stated she would like to know what the "right cocktail" is for her mother to "help her through this process".  This RN advised will send her message to NP and call her back. She verbalized understanding, appreciation.

## 2018-04-22 ENCOUNTER — Encounter: Payer: Self-pay | Admitting: Cardiology

## 2018-05-23 ENCOUNTER — Ambulatory Visit (INDEPENDENT_AMBULATORY_CARE_PROVIDER_SITE_OTHER): Payer: Medicare Other | Admitting: *Deleted

## 2018-05-23 ENCOUNTER — Telehealth: Payer: Self-pay | Admitting: Cardiology

## 2018-05-23 DIAGNOSIS — I495 Sick sinus syndrome: Secondary | ICD-10-CM

## 2018-05-23 NOTE — Telephone Encounter (Signed)
Confirmed remote transmission w/ pt husband.   

## 2018-05-24 ENCOUNTER — Encounter: Payer: Self-pay | Admitting: Cardiology

## 2018-05-24 NOTE — Progress Notes (Signed)
Remote pacemaker transmission.   

## 2018-06-16 LAB — CUP PACEART REMOTE DEVICE CHECK
Battery Voltage: 2.79 V
Brady Statistic AP VS Percent: 42 %
Brady Statistic AS VP Percent: 0 %
Brady Statistic AS VS Percent: 57 %
Date Time Interrogation Session: 20190812185818
Implantable Lead Implant Date: 20160908
Implantable Lead Implant Date: 20160908
Implantable Lead Location: 753860
Implantable Lead Model: 5076
Implantable Lead Model: 5076
Implantable Pulse Generator Implant Date: 20160908
Lead Channel Impedance Value: 538 Ohm
Lead Channel Pacing Threshold Amplitude: 0.5 V
Lead Channel Pacing Threshold Pulse Width: 0.4 ms
Lead Channel Pacing Threshold Pulse Width: 0.4 ms
MDC IDC LEAD LOCATION: 753859
MDC IDC MSMT BATTERY IMPEDANCE: 158 Ohm
MDC IDC MSMT BATTERY REMAINING LONGEVITY: 154 mo
MDC IDC MSMT LEADCHNL RA IMPEDANCE VALUE: 503 Ohm
MDC IDC MSMT LEADCHNL RV PACING THRESHOLD AMPLITUDE: 0.625 V
MDC IDC SET LEADCHNL RA PACING AMPLITUDE: 1.5 V
MDC IDC SET LEADCHNL RV PACING AMPLITUDE: 2 V
MDC IDC SET LEADCHNL RV PACING PULSEWIDTH: 0.4 ms
MDC IDC SET LEADCHNL RV SENSING SENSITIVITY: 2 mV
MDC IDC STAT BRADY AP VP PERCENT: 0 %

## 2018-07-25 ENCOUNTER — Other Ambulatory Visit: Payer: Self-pay

## 2018-08-12 ENCOUNTER — Encounter: Payer: Self-pay | Admitting: Internal Medicine

## 2018-08-12 ENCOUNTER — Encounter

## 2018-08-16 ENCOUNTER — Encounter: Payer: Self-pay | Admitting: Internal Medicine

## 2018-08-16 ENCOUNTER — Ambulatory Visit (INDEPENDENT_AMBULATORY_CARE_PROVIDER_SITE_OTHER): Payer: Medicare Other | Admitting: Internal Medicine

## 2018-08-16 ENCOUNTER — Encounter (INDEPENDENT_AMBULATORY_CARE_PROVIDER_SITE_OTHER): Payer: Self-pay

## 2018-08-16 DIAGNOSIS — Z950 Presence of cardiac pacemaker: Secondary | ICD-10-CM | POA: Diagnosis not present

## 2018-08-16 DIAGNOSIS — I495 Sick sinus syndrome: Secondary | ICD-10-CM | POA: Diagnosis not present

## 2018-08-16 NOTE — Patient Instructions (Signed)
Medication Instructions:  Your physician recommends that you continue on your current medications as directed. Please refer to the Current Medication list given to you today.   Labwork: none  Testing/Procedures: none  Follow-Up: Remote monitoring is used to monitor your Pacemaker of ICD from home. This monitoring reduces the number of office visits required to check your device to one time per year. It allows [place] to keep an eye on the functioning of your device to ensure it is working properly. You are scheduled for a device check from home on 08/22/18. You may send your transmission at any time that day. If you have a wireless device, the transmission will be sent automatically. After your physician reviews your transmission, you will receive a postcard with your next transmission date.   Your physician wants you to follow-up in: 1 year with Dr. Lovena Le. You will receive a reminder letter in the mail two months in advance. If you don't receive a letter, please call our office to schedule the follow-up appointment.   Any Other Special Instructions Will Be Listed Below (If Applicable).     If you need a refill on your cardiac medications before your next appointment, please call your pharmacy.

## 2018-08-16 NOTE — Progress Notes (Signed)
HPI Autumn Lawson returns today for followup. She is a pleasant 77 yo woman with a h/o symptomatic bradycardia,s /p PPM insertion. She has had no additional syncope since her PPM was placed. She has minimal edema. She is seeing Dr. Jannifer Lawson for her memory. She has moved to an assisted living facility.  Allergies  Allergen Reactions  . Lactose Intolerance (Gi) Other (See Comments)    Gas and bloating  . Levaquin [Levofloxacin In D5w] Swelling and Other (See Comments)    Joint swelling, flu- like symptoms  . Namenda [Memantine Hcl]     Dizzy     Current Outpatient Medications  Medication Sig Dispense Refill  . docusate sodium (COLACE) 100 MG capsule Take 100 mg by mouth at bedtime.    . donepezil (ARICEPT) 10 MG tablet Take 1 tablet (10 mg total) by mouth at bedtime. 90 tablet 3  . polyethylene glycol (MIRALAX / GLYCOLAX) packet Take 17 g by mouth daily as needed for mild constipation.    . sertraline (ZOLOFT) 25 MG tablet Take 50 mg by mouth daily.      No current facility-administered medications for this visit.      Past Medical History:  Diagnosis Date  . ADD (attention deficit disorder with hyperactivity)   . Asystole, 10 sec 06/20/2015  . Depression with anxiety   . Elevated LFTs   . Esophageal reflux   . Gallstone   . Hematoma   . Memory deficits 04/22/2015  . PTSD (post-traumatic stress disorder)   . Skin cancer, basal cell   . Varicose vein of leg    bilateral  . Vertigo   . Vertigo 03/2015    ROS:   All systems reviewed and negative except as noted in the HPI.   Past Surgical History:  Procedure Laterality Date  . ABDOMINAL HYSTERECTOMY     TAH BSO  . BACK SURGERY     rod in back placed  . CATARACT EXTRACTION     bilateral  . CESAREAN SECTION     x4  . CHOLECYSTECTOMY  1981  . EP IMPLANTABLE DEVICE N/A 06/20/2015   Procedure: Pacemaker Implant;  Surgeon: Evans Lance, MD;  Location: [facility];  Service: Cardiovascular;  Laterality: N/A;    . LIVER SURGERY  1984  . VARICOSE VEIN SURGERY       Family History  Problem Relation Age of Onset  . Leukemia Father   . Alzheimer's disease Mother   . Heart failure Sister   . Healthy Brother   . Healthy Sister   . Healthy Sister   . Healthy Brother   . Prostate cancer Other        paternal [place] grandfather     Social History   Socioeconomic History  . Marital status: Married    Spouse name: Not on file  . Number of children: 4  . Years of education: college  . Highest education level: Not on file  Occupational History  . Occupation: retired rn    Comment: RN  Social Needs  . Financial resource strain: Not on file  . Food insecurity:    Worry: Not on file    Inability: Not on file  . Transportation needs:    Medical: Not on file    Non-medical: Not on file  Tobacco Use  . Smoking status: Former Smoker    Last attempt to quit: 05/14/1976    Years since quitting: 42.2  . Smokeless tobacco: Never Used  Substance and Sexual Activity  . Alcohol use: Yes    Alcohol/week: 7.0 standard drinks    Types: 7 Glasses of wine per week    Comment: wine with dinner  . Drug use: No  . Sexual activity: Not Currently  Lifestyle  . Physical activity:    Days per week: Not on file    Minutes per session: Not on file  . Stress: Not on file  Relationships  . Social connections:    Talks on phone: Not on file    Gets together: Not on file    Attends religious service: Not on file    Active member of club or organization: Not on file    Attends meetings of clubs or organizations: Not on file    Relationship status: Not on file  . Intimate partner violence:    Fear of current or ex partner: Not on file    Emotionally abused: Not on file    Physically abused: Not on file    Forced sexual activity: Not on file  Other Topics Concern  . Not on file  Social History Narrative   Patient drinks 3 cups of caffeine daily.   Patient is right handed.     BP 122/62   Pulse 70    Ht 5\' 3"  (1.6 m)   Wt 140 lb (63.5 kg)   BMI 24.80 kg/m   Physical Exam:  Well appearing NAD HEENT: Unremarkable Neck:  No JVD, no thyromegally Lymphatics:  No adenopathy Back:  No CVA tenderness Lungs:  Clear with no wheezes HEART:  Regular rate rhythm, no murmurs, no rubs, no clicks Abd:  soft, positive bowel sounds, no organomegally, no rebound, no guarding Ext:  2 plus pulses, no edema, no cyanosis, no clubbing Skin:  No rashes no nodules Neuro:  CN II through XII intact, motor grossly intact  EKG - nsr with atrial pacing  DEVICE  Normal device function.  See PaceArt for details.   Assess/Plan: 1. Sinus node dysfunction - she is doing well s/p PPM insertion. 2. 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 We will recheck in several months. 3. Dementia - she is tolerating the aricept and has been stable. She is in assisted living.  [name].D.

## 2018-08-17 LAB — CUP PACEART INCLINIC DEVICE CHECK
Battery Impedance: 158 Ohm
Battery Remaining Longevity: 153 mo
Brady Statistic AP VS Percent: 42 %
Brady Statistic AS VP Percent: 0 %
Implantable Lead Implant Date: 20160908
Implantable Lead Location: 753859
Implantable Lead Model: 5076
Implantable Lead Model: 5076
Lead Channel Impedance Value: 476 Ohm
Lead Channel Pacing Threshold Amplitude: 0.625 V
Lead Channel Pacing Threshold Amplitude: 0.75 V
Lead Channel Pacing Threshold Pulse Width: 0.4 ms
Lead Channel Pacing Threshold Pulse Width: 0.4 ms
Lead Channel Pacing Threshold Pulse Width: 0.4 ms
Lead Channel Sensing Intrinsic Amplitude: 2.8 mV
Lead Channel Sensing Intrinsic Amplitude: 4 mV
Lead Channel Setting Pacing Amplitude: 1.5 V
Lead Channel Setting Pacing Amplitude: 2 V
Lead Channel Setting Pacing Pulse Width: 0.4 ms
MDC IDC LEAD IMPLANT DT: 20160908
MDC IDC LEAD LOCATION: 753860
MDC IDC MSMT BATTERY VOLTAGE: 2.79 V
MDC IDC MSMT LEADCHNL RA PACING THRESHOLD AMPLITUDE: 0.375 V
MDC IDC MSMT LEADCHNL RA PACING THRESHOLD AMPLITUDE: 0.5 V
MDC IDC MSMT LEADCHNL RA PACING THRESHOLD PULSEWIDTH: 0.4 ms
MDC IDC MSMT LEADCHNL RV IMPEDANCE VALUE: 571 Ohm
MDC IDC PG IMPLANT DT: 20160908
MDC IDC SESS DTM: 20191105201414
MDC IDC SET LEADCHNL RV SENSING SENSITIVITY: 2 mV
MDC IDC STAT BRADY AP VP PERCENT: 0 %
MDC IDC STAT BRADY AS VS PERCENT: 58 %

## 2018-08-22 ENCOUNTER — Telehealth: Payer: Self-pay | Admitting: Cardiology

## 2018-08-22 ENCOUNTER — Encounter: Payer: Medicare Other | Admitting: *Deleted

## 2018-08-22 NOTE — Telephone Encounter (Signed)
Spoke with pt and reminded pt of remote transmission that is due today. Pt verbalized understanding.   

## 2018-08-24 ENCOUNTER — Encounter: Payer: Self-pay | Admitting: Cardiology

## 2018-08-29 ENCOUNTER — Ambulatory Visit (INDEPENDENT_AMBULATORY_CARE_PROVIDER_SITE_OTHER): Payer: Medicare Other

## 2018-08-29 DIAGNOSIS — I495 Sick sinus syndrome: Secondary | ICD-10-CM | POA: Diagnosis not present

## 2018-08-30 NOTE — Progress Notes (Signed)
Remote pacemaker transmission.   

## 2018-09-01 ENCOUNTER — Encounter: Payer: Self-pay | Admitting: Cardiology

## 2018-10-23 NOTE — Progress Notes (Signed)
[facility]  PATIENT: Autumn Lawson DOB: 06-23-1941   REASON FOR VISIT: Follow-up for memory loss HISTORY FROM: Patient and husband [place] RJJOACZ:YSAYTK 1/13/2020CM [name], 78 year old female returns for follow-up with history of progressive memory disturbance.  She is currently on Aricept had dizziness on Namenda.  She has been in an assisted living for 2-1/2 years.  She participates in very few activities there.  She does not operate a motor vehicle she is independent in her activities of daily living in terms of bathing dressing and feeding.  She is unable to shop prepare food  Food, do any housekeeping , no driving she is not responsible for her medicines and she cannot handle her finances.  She has a history of obstructive sleep apnea but does not use her CPAP.  She continues to have word finding difficulties.  She does walk the dog several times a day.  She returns for reevaluation   6/14/19CMMs. Sinyard is a 78 year old right-handed white female with a history of a progressive memory disturbance. The patient has been on Aricept, she could not tolerate Namenda secondary to dizziness. She has been residing in assisted living for two years.  She participates in some of the activities there. She does not operate a motor vehicle at  this time.  She goes to the dining room for her meals.  She has been seen by Dr. Rexene Alberts for sleep apnea, but she has not been able to tolerate the machine. She has not been using the CPAP. She believes that her memory is about the same .The husband states she has word finding difficulty at times.  The patient returns to this office for an evaluation.   REVIEW OF SYSTEMS: Full 14 system review of systems performed and notable only for those listed, all others are neg:  Constitutional: neg  Cardiovascular: neg Ear/Nose/Throat: neg  Skin: neg Eyes: Decreased vision, dry eyes Respiratory: neg Gastroitestinal: neg    Hematology/Lymphatic: neg  Endocrine: Intolerance to cold Musculoskeletal:neg Allergy/Immunology: Food allergies Neurological: Memory issues  Psychiatric: Anxiety, depression Sleep : neg   ALLERGIES: Allergies  Allergen Reactions  . Lactose Intolerance (Gi) Other (See Comments)    Gas and bloating  . Levaquin [Levofloxacin In D5w] Swelling and Other (See Comments)    Joint swelling, flu- like symptoms  . Pearlean Brownie Hcl]     Dizzy    HOME MEDICATIONS: Outpatient Medications Prior to Visit  Medication Sig Dispense Refill  . acetaminophen (TYLENOL) 325 MG tablet Take 650 mg by mouth every 6 (six) hours as needed. Per AL MAR, can take every 3 hours as needed, pain    . docusate sodium (COLACE) 100 MG capsule Take 100 mg by mouth at bedtime.    . donepezil (ARICEPT) 10 MG tablet Take 1 tablet (10 mg total) by mouth at bedtime. 90 tablet 3  . ibuprofen (ADVIL,MOTRIN) 200 MG tablet Take 400 mg by mouth every 6 (six) hours as needed. Per LA MAR, may take every 3 hours as needed    . loratadine (CLARITIN) 10 MG tablet Take 10 mg by mouth daily.    . meclizine (ANTIVERT) 12.5 MG tablet Take 12.5 mg by mouth 3 (three) times daily as needed for dizziness.    . sertraline (ZOLOFT) 25 MG tablet Take 50 mg by mouth daily.     . polyethylene glycol (MIRALAX / GLYCOLAX) packet Take 17 g by mouth daily as needed for mild constipation.  No facility-administered medications prior to visit.     PAST MEDICAL HISTORY: Past Medical History:  Diagnosis Date  . ADD (attention deficit disorder with hyperactivity)   . Asystole, 10 sec 06/20/2015  . Depression with anxiety   . Elevated LFTs   . Esophageal reflux   . Gallstone   . Hematoma   . Memory deficits 04/22/2015  . PTSD (post-traumatic stress disorder)   . Skin cancer, basal cell   . Varicose vein of leg    bilateral  . Vertigo   . Vertigo 03/2015    PAST SURGICAL HISTORY: Past Surgical History:  Procedure Laterality Date   . ABDOMINAL HYSTERECTOMY     TAH BSO  . BACK SURGERY     rod in back placed  . CATARACT EXTRACTION     bilateral  . CESAREAN SECTION     x4  . CHOLECYSTECTOMY  1981  . EP IMPLANTABLE DEVICE N/A 06/20/2015   Procedure: Pacemaker Implant;  Surgeon: Evans Lance, MD;  Location: [facility];  Service: Cardiovascular;  Laterality: N/A;  . LIVER SURGERY  1984  . MOHS SURGERY     x 3 on face  . VARICOSE VEIN SURGERY      FAMILY HISTORY: Family History  Problem Relation Age of Onset  . Leukemia Father   . Alzheimer's disease Mother   . Heart failure Sister   . Healthy Brother   . Healthy Sister   . Healthy Sister   . Healthy Brother   . Prostate cancer Other        paternal [place] grandfather    SOCIAL HISTORY: Social History   Socioeconomic History  . Marital status: Married    Spouse name: Not on file  . Number of children: 4  . Years of education: college  . Highest education level: Not on file  Occupational History  . Occupation: retired rn    Comment: RN  Social Needs  . Financial resource strain: Not on file  . Food insecurity:    Worry: Not on file    Inability: Not on file  . Transportation needs:    Medical: Not on file    Non-medical: Not on file  Tobacco Use  . Smoking status: Former Smoker    Last attempt to quit: 05/14/1976    Years since quitting: 42.4  . Smokeless tobacco: Never Used  Substance and Sexual Activity  . Alcohol use: Yes    Alcohol/week: 7.0 standard drinks    Types: 7 Glasses of wine per week    Comment: wine with dinner  . Drug use: No  . Sexual activity: Not Currently  Lifestyle  . Physical activity:    Days per week: Not on file    Minutes per session: Not on file  . Stress: Not on file  Relationships  . Social connections:    Talks on phone: Not on file    Gets together: Not on file    Attends religious service: Not on file    Active member of club or organization: Not on file    Attends meetings of clubs or  organizations: Not on file    Relationship status: Not on file  . Intimate partner violence:    Fear of current or ex partner: Not on file    Emotionally abused: Not on file    Physically abused: Not on file    Forced sexual activity: Not on file  Other Topics Concern  . Not on file  Social History Narrative   Patient drinks 3 cups of caffeine daily.   Patient is right handed.       PHYSICAL EXAM  Vitals:   10/24/18 1050  BP: 123/68  Pulse: 81  Weight: 136 lb 9.6 oz (62 kg)  Height: 5\' 3"  (1.6 m)   Body mass index is 24.2 kg/m.  Generalized: Well developed, in no acute distress  Head: normocephalic and atraumatic,. Oropharynx benign  Neck: Supple,  Skin no edema     Neurological examination   Mentation: Alert . MMSE - Mini Mental State Exam 10/24/2018 03/25/2018 02/23/2017  Orientation to time 1 0 2  Orientation to Place 1 3 3   Registration 3 3 3   Attention/ Calculation 2 3 5   Recall 0 2 1  Language- name 2 objects 2 2 2   Language- repeat 0 0 0  Language- follow 3 step command 3 3 3   Language- read & follow direction 1 1 1   Write a sentence 1 1 1   Copy design 1 1 1   Total score 15 19 22     AFT 6. Clock drawing 3/4 Follows all commands speech and language fluent.  .   Cranial nerve II-XII: Pupils were equal round reactive to light extraocular movements were full, visual field were full on confrontational test. Facial sensation and strength were normal. hearing was intact to finger rubbing bilaterally. Uvula tongue midline. head turning and shoulder shrug were normal and symmetric.Tongue protrusion into cheek strength was normal. Motor: normal bulk and tone, full strength in the BUE, BLE, Sensory: normal and symmetric to light touch,  Coordination: finger-nose-finger, heel-to-shin bilaterally, no dysmetria Reflexes: Symmetric upper and lower plantar responses were flexor bilaterally. Gait and Station: Rising up from seated position without assistance, normal  stance,  moderate stride, good arm swing, smooth turning, able to perform tiptoe, and heel walking without difficulty. Tandem gait is unsteady, no assistive device  DIAGNOSTIC DATA (LABS, IMAGING, TESTING) - I reviewed patient records, labs, notes, testing and imaging myself where available.  Lab Results  Component Value Date   WBC 6.2 08/13/2016   HGB 13.5 08/13/2016   HCT 40.5 08/13/2016   MCV 99.0 08/13/2016   PLT 203 08/13/2016      Component Value Date/Time   NA 141 08/13/2016 1053   K 3.9 08/13/2016 1053   CL 109 08/13/2016 1053   CO2 26 08/13/2016 1053   GLUCOSE 93 08/13/2016 1053   BUN 12 08/13/2016 1053   CREATININE 0.59 08/13/2016 1053   CALCIUM 9.2 08/13/2016 1053   PROT 6.1 (L) 08/13/2016 1235   ALBUMIN 3.7 08/13/2016 1235   AST 24 08/13/2016 1235   ALT 28 08/13/2016 1235   ALKPHOS 80 08/13/2016 1235   BILITOT 0.3 08/13/2016 1235   GFRNONAA >60 08/13/2016 1053   GFRAA >60 08/13/2016 1053     Lab Results  Component Value Date   IBBCWUGQ91 694 03/22/2015   Lab Results  Component Value Date   TSH 1.406 03/22/2015      ASSESSMENT AND PLAN  78 y.o. 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  Patient had side effects to Namenda(dizziness)   PLAN: Continue Aricept for memory  Get regular exercise continue walking the dog Participate in group activities strategy games  Try [facility] [place] healthy diet  F/U in 6 months I spent 58min  in total  face to face time with the patient /husbandmore than 50% of which was spent counseling and coordination of care, reviewing test results reviewing medications and discussing and reviewing the diagnosis of progressive memory disorder  Also patient encouraged to be more involved in activities at assisted living  and exercise by walking at  least daily Symptoms of untreated OSA include daytime sleepiness, memory problems, mood irritability and mood disorder such as depression and anxiety, lack of energy, as well as recurrent headaches, especially morning headaches.  Husband was given ideas for other services in the community for senior citizens.  He had multiple questions that were answered Dennie Bible, [facility], [facility], APRN  [facility] [address] [place], [place] [address] [phone]

## 2018-10-24 ENCOUNTER — Ambulatory Visit (INDEPENDENT_AMBULATORY_CARE_PROVIDER_SITE_OTHER): Payer: Medicare Other | Admitting: Nurse Practitioner

## 2018-10-24 ENCOUNTER — Encounter: Payer: Self-pay | Admitting: Nurse Practitioner

## 2018-10-24 VITALS — BP 123/68 | HR 81 | Ht 63.0 in | Wt 136.6 lb

## 2018-10-24 DIAGNOSIS — F329 Major depressive disorder, single episode, unspecified: Secondary | ICD-10-CM

## 2018-10-24 DIAGNOSIS — R2681 Unsteadiness on feet: Secondary | ICD-10-CM | POA: Diagnosis not present

## 2018-10-24 DIAGNOSIS — F32A Depression, unspecified: Secondary | ICD-10-CM

## 2018-10-24 DIAGNOSIS — G4733 Obstructive sleep apnea (adult) (pediatric): Secondary | ICD-10-CM | POA: Diagnosis not present

## 2018-10-24 DIAGNOSIS — R413 Other amnesia: Secondary | ICD-10-CM

## 2018-10-24 NOTE — Patient Instructions (Signed)
Continue Aricept for memory  Get regular exercise continue walking the dog Participate in group activities strategy games  Try [facility] [place] healthy diet  F/U in 6 months

## 2018-10-24 NOTE — Progress Notes (Signed)
I have read the note, and I agree with the clinical assessment and plan.  Octavius Shin K Tinie Mcgloin   

## 2018-11-10 LAB — CUP PACEART REMOTE DEVICE CHECK
Battery Remaining Longevity: 153 mo
Battery Voltage: 2.79 V
Brady Statistic AS VP Percent: 0 %
Date Time Interrogation Session: 20191118205435
Implantable Lead Implant Date: 20160908
Implantable Lead Location: 753860
Implantable Lead Model: 5076
Implantable Lead Model: 5076
Implantable Pulse Generator Implant Date: 20160908
Lead Channel Pacing Threshold Amplitude: 0.375 V
Lead Channel Pacing Threshold Pulse Width: 0.4 ms
Lead Channel Pacing Threshold Pulse Width: 0.4 ms
Lead Channel Setting Pacing Amplitude: 1.5 V
Lead Channel Setting Pacing Pulse Width: 0.4 ms
MDC IDC LEAD IMPLANT DT: 20160908
MDC IDC LEAD LOCATION: 753859
MDC IDC MSMT BATTERY IMPEDANCE: 158 Ohm
MDC IDC MSMT LEADCHNL RA IMPEDANCE VALUE: 488 Ohm
MDC IDC MSMT LEADCHNL RV IMPEDANCE VALUE: 574 Ohm
MDC IDC MSMT LEADCHNL RV PACING THRESHOLD AMPLITUDE: 0.625 V
MDC IDC SET LEADCHNL RV PACING AMPLITUDE: 2 V
MDC IDC SET LEADCHNL RV SENSING SENSITIVITY: 2 mV
MDC IDC STAT BRADY AP VP PERCENT: 0 %
MDC IDC STAT BRADY AP VS PERCENT: 46 %
MDC IDC STAT BRADY AS VS PERCENT: 53 %

## 2018-11-30 ENCOUNTER — Telehealth: Payer: Self-pay

## 2018-11-30 NOTE — Telephone Encounter (Signed)
Left message for patient to remind of missed remote transmission.  

## 2018-12-09 ENCOUNTER — Encounter: Payer: Self-pay | Admitting: Cardiology

## 2018-12-11 ENCOUNTER — Other Ambulatory Visit
Admission: RE | Admit: 2018-12-11 | Discharge: 2018-12-11 | Disposition: A | Discharge status: Home / Self Care | Payer: Medicare Other | Source: Ambulatory Visit | Attending: *Deleted | Admitting: *Deleted

## 2018-12-11 LAB — URINALYSIS, COMPLETE (UACMP) WITH MICROSCOPIC
BILIRUBIN URINE: NEGATIVE
GLUCOSE, UA: NEGATIVE mg/dL
KETONES UR: NEGATIVE mg/dL
Nitrite: NEGATIVE
PROTEIN: NEGATIVE mg/dL
Specific Gravity, Urine: 1.002 — ABNORMAL LOW (ref 1.005–1.030)
Squamous Epithelial / LPF: NONE SEEN (ref 0–5)
pH: 7 (ref 5.0–8.0)

## 2019-01-12 ENCOUNTER — Telehealth: Payer: Self-pay

## 2019-01-12 ENCOUNTER — Telehealth: Payer: Self-pay | Admitting: Neurology

## 2019-01-12 MED ORDER — TRAZODONE HCL 50 MG PO TABS: 50.0000 mg | ORAL_TABLET | Freq: Every day | ORAL | 1 refills | Status: DC

## 2019-01-12 NOTE — Telephone Encounter (Signed)
Kassie Mends director from [facility] called wanting to update Dr. Jannifer Franklin on recommendations from PCP.  Over the past several months the pt has been participating in OT, [place] and [place]. Along with this therapy pt has had several cognitive evaluations.   On 07/28/2018 pt scored a 18 on slum  On 07/28/2018 pt scored a 10/30 on mocha On 12/08/2018 pt scored a 18 on Bcat  And on 12/08/18 pt scored a 15/30 on mocha.  Due to recent cognitive testing, pcp has recommended pt be placed in the memory care unit within [facility].   Ivin Booty states if any feed back from Dr. Jannifer Franklin is needed to call [phone].

## 2019-01-12 NOTE — Telephone Encounter (Signed)
Pt's husband Saralyn Pilar request call from Dr Jannifer Franklin. He did not want to leave any information regarding the call

## 2019-01-12 NOTE — Telephone Encounter (Signed)
I called the husband.  The [facility] facility is indicated that the patient needs to be moved from assisted living to a memory disorders unit, and that she can no longer have her dog.  The patient has had a gradual decline in her memory, she is getting out to walk the dog which puts the facility at liability risk, she is sometimes staying up late at night.  The husband wants something for sleep, I will call in low-dose trazodone.  He wishes to set up an revisit, I will try to get something set up for next week.  A WebEx meeting with them will require that the daughter be involved, the patient does not have a smart phone and has no knowledge of technology.  The husband will be taking the patient home next week.

## 2019-01-16 NOTE — Telephone Encounter (Signed)
I contacted the pt's husband. He states the pt is going to be moving back home today and daughter will not be available to discuss virtual visit until 01/18/19.  I advised the husband once pt is moved home and daughter is available to discuss a virtual visit to call me back and we would look at a date to schedule. Pt's husband was agreeable and states he will call back.

## 2019-01-18 ENCOUNTER — Ambulatory Visit (INDEPENDENT_AMBULATORY_CARE_PROVIDER_SITE_OTHER): Payer: Medicare Other | Admitting: Neurology

## 2019-01-18 ENCOUNTER — Other Ambulatory Visit: Payer: Self-pay

## 2019-01-18 ENCOUNTER — Encounter: Payer: Self-pay | Admitting: Neurology

## 2019-01-18 DIAGNOSIS — R413 Other amnesia: Secondary | ICD-10-CM | POA: Diagnosis not present

## 2019-01-18 NOTE — Progress Notes (Signed)
    Virtual Visit via Video Note  I connected with Salena Saner on 01/18/19 at  3:30 PM EDT by a video enabled telemedicine application and verified that I am speaking with the correct person using two identifiers.   I discussed the limitations of evaluation and management by telemedicine and the availability of in person appointments. The patient expressed understanding and agreed to proceed.  History of Present Illness: Autumn Lawson is a 78 year old right-handed white female with a history of progressive memory disorder consistent with Alzheimer's disease.  The patient has been living in an assisted living facility, [facility].  The patient has had some progression of her cognitive issues, the facility recommended a transfer to a memory disorders unit which would mean that the patient could no longer have her dog with her.  Her dog apparently needs quite a bit to her, and therefore her husband decided to pull her out of the facility and bring her home.  She has been married to her husband for 60 years but they have never really gotten along completely well, there is always some tension between them.  The patient is now staying awake most of the night, she takes catnaps during the day.  There may be some agitation in the evening, particularly when she is interacting with her husband.  The patient has a good appetite otherwise.  She is on Aricept.  The patient has had some reduction in her physical activities, she used to go to [employer] but she has cut back on doing that.  There has been some progression of her cognitive issues since last seen 6 months ago, she is not reading as much.  She has now returned home, they are to start trazodone at night for sleep.   Observations/Objective: On the WebEx visit, the patient is alert and cooperative, speech is well enunciated, not aphasic or dysarthric.  Extraocular movements are full.  Facial symmetry is present.  Assessment and Plan: 1.   Progressive memory disturbance, Alzheimer's disease  2.  Insomnia  The patient is staying awake a good portion of the night, she is calling her daughter at 11:30 in the evening.  The trazodone hopefully will allow her to rest better at night, but if it increases confusion we may have to use another medication.  The patient will have a CNA that comes out of the house between 9 AM and 5 PM 5 days a week to help keep her occupied and increase her physical activity.  The patient will be able to keep her dog as a pet.  She will continue the Aricept.  The family will call me about dose adjustments of the trazodone.  Follow Up Instructions: Follow-up for next scheduled visit on 24 April 2019.   I discussed the assessment and treatment plan with the patient. The patient was provided an opportunity to ask questions and all were answered. The patient agreed with the plan and demonstrated an understanding of the instructions.   The patient was advised to call back or seek an in-person evaluation if the symptoms worsen or if the condition fails to improve as anticipated.  I provided 15 minutes of non-face-to-face time during this encounter.   Kathrynn Ducking, MD

## 2019-01-18 NOTE — Telephone Encounter (Signed)
Pateint's daughter Autumn Lawson (ok per dpr) returned my call. I was able to discuss completing a virtual visit for pt per Dr. Jannifer Franklin request.  Pt's daughter understands that although there may be some limitations with this type of visit, we will take all precautions to reduce any security or privacy concerns.  She also understands that this will be treated like an in office visit and we will file with pt's insurance, and there may be a patient responsible charge related to this service.  Pt's daughter's email is [email or web address]. Pt understands that the [employer] webex software must be downloaded and operational on the device pt plans to use for the visit.  I was able to schedule the pt for 3:30 pm today (01/18/19).   I was also able to complete pre charting for patient's visit with her daughter.

## 2019-01-23 ENCOUNTER — Telehealth: Payer: Self-pay | Admitting: Neurology

## 2019-01-23 ENCOUNTER — Other Ambulatory Visit: Payer: Self-pay

## 2019-01-23 ENCOUNTER — Ambulatory Visit (INDEPENDENT_AMBULATORY_CARE_PROVIDER_SITE_OTHER): Payer: Medicare Other | Admitting: *Deleted

## 2019-01-23 DIAGNOSIS — I495 Sick sinus syndrome: Secondary | ICD-10-CM

## 2019-01-23 LAB — CUP PACEART REMOTE DEVICE CHECK
Battery Impedance: 182 Ohm
Battery Remaining Longevity: 150 mo
Battery Voltage: 2.79 V
Brady Statistic AP VP Percent: 0 %
Brady Statistic AP VS Percent: 40 %
Brady Statistic AS VP Percent: 0 %
Brady Statistic AS VS Percent: 59 %
Date Time Interrogation Session: 20200413164517
Implantable Lead Implant Date: 20160908
Implantable Lead Implant Date: 20160908
Implantable Lead Location: 753859
Implantable Lead Location: 753860
Implantable Lead Model: 5076
Implantable Lead Model: 5076
Implantable Pulse Generator Implant Date: 20160908
Lead Channel Impedance Value: 538 Ohm
Lead Channel Impedance Value: 607 Ohm
Lead Channel Pacing Threshold Amplitude: 0.375 V
Lead Channel Pacing Threshold Amplitude: 0.625 V
Lead Channel Pacing Threshold Pulse Width: 0.4 ms
Lead Channel Pacing Threshold Pulse Width: 0.4 ms
Lead Channel Setting Pacing Amplitude: 1.5 V
Lead Channel Setting Pacing Amplitude: 2 V
Lead Channel Setting Pacing Pulse Width: 0.4 ms
Lead Channel Setting Sensing Sensitivity: 2 mV

## 2019-01-23 NOTE — Telephone Encounter (Signed)
Pt husband(on DPR) has called re: the traZODone (DESYREL) 50 MG tablet he has called to inform that he has given this medication as early as 5pm and pt still does not go to bed until 11:00 or 11:30.  Husband is asking for a call to discuss

## 2019-01-23 NOTE — Telephone Encounter (Signed)
I contacted the pt's husband ok per dpr. I advised Dr. Jannifer Franklin is currently out of the office and will return on 01/25/19. I advised once he returned I would fwd the message and pt's husband was agreeable.

## 2019-01-25 NOTE — Telephone Encounter (Signed)
The 50 mg dose of trazodone is not effective for sleep, the patient will go up on 100 mg at night, they will call if this is not effective, we will continue to increase the dose.

## 2019-02-03 ENCOUNTER — Encounter: Payer: Self-pay | Admitting: Cardiology

## 2019-02-03 NOTE — Progress Notes (Signed)
Remote pacemaker transmission.   

## 2019-02-06 ENCOUNTER — Telehealth: Payer: Self-pay | Admitting: Neurology

## 2019-02-06 MED ORDER — ALPRAZOLAM 0.5 MG PO TABS: 0.5000 mg | ORAL_TABLET | Freq: Every evening | ORAL | 1 refills | Status: DC | PRN

## 2019-02-06 MED ORDER — SERTRALINE HCL 100 MG PO TABS: 150.0000 mg | ORAL_TABLET | Freq: Every day | ORAL | 3 refills | Status: DC

## 2019-02-06 NOTE — Telephone Encounter (Signed)
Pt's husband would like to discuss his wife's med's Trazodone 50MG  and donepezil 10mg  tab he can be reached [phone].

## 2019-02-06 NOTE — Telephone Encounter (Signed)
This patient is on 150 mg of Zoloft in the morning.

## 2019-02-06 NOTE — Telephone Encounter (Signed)
I called the husband.  The patient is not sleeping well on the trazodone.  We will stop the medication, she was taking 100 mg at night.  She is on Zoloft and 50 mg in the morning.  We will try low-dose alprazolam in the evening for sleep.

## 2019-02-10 ENCOUNTER — Telehealth: Payer: Self-pay | Admitting: Neurology

## 2019-02-10 MED ORDER — DONEPEZIL HCL 10 MG PO TABS: 10.0000 mg | ORAL_TABLET | Freq: Every day | ORAL | 3 refills | Status: DC

## 2019-02-10 NOTE — Telephone Encounter (Signed)
rx renewed for Aricept.

## 2019-02-10 NOTE — Telephone Encounter (Signed)
Pt husband called in and requested refill of donepezil (ARICEPT) 10 MG tablet sent to [facility] [address] [place]

## 2019-02-13 MED ORDER — DONEPEZIL HCL 10 MG PO TABS: 10.0000 mg | ORAL_TABLET | Freq: Every day | ORAL | 3 refills | Status: DC

## 2019-02-13 NOTE — Telephone Encounter (Signed)
pts husband called in and state the pharmacy is stating they havent received the aricept refill request

## 2019-02-13 NOTE — Addendum Note (Signed)
Addended by: Verlin Grills T on: 02/13/2019 03:47 PM   Modules accepted: Orders

## 2019-02-13 NOTE — Telephone Encounter (Signed)
Rx refilled to [facility] on [facility].

## 2019-02-23 ENCOUNTER — Telehealth: Payer: Self-pay

## 2019-02-23 NOTE — Telephone Encounter (Signed)
Pt husband states the pt had an episode last night and believes the pt fell because she has bruises on her. He sent a transmission with th pt monitor. He would like for the nurse to call him back as soon as possible. The best phone number is [phone].

## 2019-02-23 NOTE — Telephone Encounter (Signed)
Transmission reviewed. Normal device function. No episodes.   Per husband, she went to bed last night and this morning she has abrasions on each arm. She states that she fell against the dresser but isn't sure what happened.   She had eaten and drank like normal yesterday.  No other acute issues. Advised will send to Dr Lovena Le for further recommendations.   Chanetta Marshall, NP 02/23/2019 11:40 AM

## 2019-03-08 ENCOUNTER — Telehealth: Payer: Self-pay | Admitting: Neurology

## 2019-03-08 MED ORDER — ALPRAZOLAM 0.5 MG PO TABS: 0.5000 mg | ORAL_TABLET | Freq: Every evening | ORAL | 1 refills | Status: DC | PRN

## 2019-03-08 NOTE — Telephone Encounter (Signed)
I called and talk with the husband.  I will go ahead and send in a 90-day prescription for alprazolam.

## 2019-03-08 NOTE — Telephone Encounter (Signed)
Pt husband is asking if Dr Jannifer Franklin will allow pt to get her ALPRAZolam (XANAX) 0.5 MG tablet as a 90 day. Husband states there is about a $.30 cents difference between a 30 day and a 90 day

## 2019-03-08 NOTE — Addendum Note (Signed)
Addended by: Kathrynn Ducking on: 03/08/2019 12:17 PM   Modules accepted: Orders

## 2019-03-21 ENCOUNTER — Telehealth: Payer: Self-pay | Admitting: Neurology

## 2019-03-21 MED ORDER — MECLIZINE HCL 12.5 MG PO TABS: 12.5000 mg | ORAL_TABLET | Freq: Three times a day (TID) | ORAL | 3 refills | Status: DC | PRN

## 2019-03-21 NOTE — Telephone Encounter (Signed)
Noted  

## 2019-03-21 NOTE — Addendum Note (Signed)
Addended by: Kathrynn Ducking on: 03/21/2019 01:58 PM   Modules accepted: Orders

## 2019-03-21 NOTE — Telephone Encounter (Signed)
Pt's husband called in today, they are requesting a refill on the meclizine for her vertigo. He said ideally 3 months would be good so she could take it as needed. Also requested a call back from Dr. Jannifer Franklin' nurse. I did verify her preferred pharmacy as [facility]. Best call back number is [phone].

## 2019-03-21 NOTE — Telephone Encounter (Signed)
I called the husband.  The patient takes meclizine in low-dose if needed for vertigo.  I will send in a prescription for this.

## 2019-04-23 NOTE — Progress Notes (Signed)
Lawson: Autumn Lawson DOB: Nov 01, 1940  REASON FOR VISIT: follow up HISTORY FROM: Lawson  HISTORY OF PRESENT ILLNESS: Today 04/24/19 Autumn Lawson is an 78 year old female with history of progressive memory disorder consistent with Alzheimer's disease.  In April 2020 she was started on trazodone for trouble sleeping, but it was not beneficial and she was switched to alprazolam.  Her last MMSE in January 2020 was 15/30.  She remains on Aricept, cannot tolerate Namenda in Autumn past due to side effect of dizziness.  She was residing at [facility] assisted living, however she was unable to have her dog, so her husband now cares for her in their home.  She presents today for follow-up accompanied by her husband.  He indicates, he has not seen much change in her sleep with Autumn addition of alprazolam.  He reports she may go into Autumn bedroom around 11 PM, she will start folding clothes.  He is not sure exactly what time she goes to bed.  He himself will go to bed, he will check on her during Autumn night, and she has been sleeping.  She will sleep till about 9 AM, and her caregiver will get her ready for Autumn day.  They have been doing well with Autumn transition to home care.  Her husband has an aide who comes Monday through Friday.  He does indicate since her last visit, she has lost 15 pounds.  He indicates she has a good appetite, she eats 3 meals a day. She presents for follow-up accompanied by her husband.   HISTORY 01/18/2019 Dr. Jannifer Franklin: Autumn Lawson is a 78 year old right-handed white female with a history of progressive memory disorder consistent with Alzheimer's disease.  Autumn Lawson has been living in an assisted living facility, [facility].  Autumn Lawson has had some progression of her cognitive issues, Autumn facility recommended a transfer to a memory disorders unit which would mean that Autumn Lawson could no longer have her dog with her.  Her dog apparently needs quite a bit to her, and therefore her  husband decided to pull her out of Autumn facility and bring her home.  She has been married to her husband for 60 years but they have never really gotten along completely well, there is always some tension between them.  Autumn Lawson is now staying awake most of Autumn night, she takes catnaps during Autumn day.  There may be some agitation in Autumn evening, particularly when she is interacting with her husband.  Autumn Lawson has a good appetite otherwise.  She is on Aricept.  Autumn Lawson has had some reduction in her physical activities, she used to go to [employer] but she has cut back on doing that.  There has been some progression of her cognitive issues since last seen 6 months ago, she is not reading as much.  She has now returned home, they are to start trazodone at night for sleep.  REVIEW OF SYSTEMS: Out of a complete 14 system review of symptoms, Autumn Lawson complains only of Autumn following symptoms, and all other reviewed systems are negative.  Memory loss, trouble sleeping  ALLERGIES: Allergies  Allergen Reactions  . Lactose Intolerance (Gi) Other (See Comments)    Gas and bloating  . Levaquin [Levofloxacin In D5w] Swelling and Other (See Comments)    Joint swelling, flu- like symptoms  . Pearlean Brownie Hcl]     Dizzy    HOME MEDICATIONS: Outpatient Medications Prior to Visit  Medication Sig Dispense  Refill  . acetaminophen (TYLENOL) 325 MG tablet Take 650 mg by mouth every 6 (six) hours as needed. Per AL MAR, can take every 3 hours as needed, pain    . ALPRAZolam (XANAX) 0.5 MG tablet Take 1 tablet (0.5 mg total) by mouth at bedtime as needed for anxiety. 90 tablet 1  . docusate sodium (COLACE) 100 MG capsule Take 100 mg by mouth at bedtime.    . donepezil (ARICEPT) 10 MG tablet Take 1 tablet (10 mg total) by mouth at bedtime. 90 tablet 3  . ibuprofen (ADVIL,MOTRIN) 200 MG tablet Take 400 mg by mouth every 6 (six) hours as needed. Per LA MAR, may take every 3 hours as needed    .  loratadine (CLARITIN) 10 MG tablet Take 10 mg by mouth daily.    . meclizine (ANTIVERT) 12.5 MG tablet Take 1 tablet (12.5 mg total) by mouth 3 (three) times daily as needed for dizziness. 90 tablet 3  . polyethylene glycol (MIRALAX / GLYCOLAX) packet Take 17 g by mouth daily as needed for mild constipation.    . sertraline (ZOLOFT) 100 MG tablet Take 1.5 tablets (150 mg total) by mouth daily. 135 tablet 3   No facility-administered medications prior to visit.     PAST MEDICAL HISTORY: Past Medical History:  Diagnosis Date  . ADD (attention deficit disorder with hyperactivity)   . Asystole, 10 sec 06/20/2015  . Depression with anxiety   . Elevated LFTs   . Esophageal reflux   . Gallstone   . Hematoma   . Memory deficits 04/22/2015  . PTSD (post-traumatic stress disorder)   . Skin cancer, basal cell   . Varicose vein of leg    bilateral  . Vertigo   . Vertigo 03/2015    PAST SURGICAL HISTORY: Past Surgical History:  Procedure Laterality Date  . ABDOMINAL HYSTERECTOMY     TAH BSO  . BACK SURGERY     rod in back placed  . CATARACT EXTRACTION     bilateral  . CESAREAN SECTION     x4  . CHOLECYSTECTOMY  1981  . EP IMPLANTABLE DEVICE N/A 06/20/2015   Procedure: Pacemaker Implant;  Surgeon: Evans Lance, MD;  Location: [facility];  Service: Cardiovascular;  Laterality: N/A;  . LIVER SURGERY  1984  . MOHS SURGERY     x 3 on face  . VARICOSE VEIN SURGERY      FAMILY HISTORY: Family History  Problem Relation Age of Onset  . Leukemia Father   . Alzheimer's disease Mother   . Heart failure Sister   . Healthy Brother   . Healthy Sister   . Healthy Sister   . Healthy Brother   . Prostate cancer Other        paternal [place] grandfather    SOCIAL HISTORY: Social History   Socioeconomic History  . Marital status: Married    Spouse name: Not on file  . Number of children: 4  . Years of education: college  . Highest education level: Not on file  Occupational  History  . Occupation: retired rn    Comment: RN  Social Needs  . Financial resource strain: Not on file  . Food insecurity    Worry: Not on file    Inability: Not on file  . Transportation needs    Medical: Not on file    Non-medical: Not on file  Tobacco Use  . Smoking status: Former Smoker    Quit date: 05/14/1976  Years since quitting: 42.9  . Smokeless tobacco: Never Used  Substance and Sexual Activity  . Alcohol use: Yes    Alcohol/week: 7.0 standard drinks    Types: 7 Glasses of wine per week    Comment: wine with dinner  . Drug use: No  . Sexual activity: Not Currently  Lifestyle  . Physical activity    Days per week: Not on file    Minutes per session: Not on file  . Stress: Not on file  Relationships  . Social Herbalist on phone: Not on file    Gets together: Not on file    Attends religious service: Not on file    Active member of club or organization: Not on file    Attends meetings of clubs or organizations: Not on file    Relationship status: Not on file  . Intimate partner violence    Fear of current or ex partner: Not on file    Emotionally abused: Not on file    Physically abused: Not on file    Forced sexual activity: Not on file  Other Topics Concern  . Not on file  Social History Narrative   Lawson drinks 3 cups of caffeine daily.   Lawson is right handed.      PHYSICAL EXAM  Vitals:   04/24/19 1046  BP: 136/76  Pulse: 81  Temp: (!) 96.9 F (36.1 C)  Weight: 121 lb 6.4 oz (55.1 kg)  Height: 5\' 3"  (1.6 m)   Body mass index is 21.51 kg/m.  Generalized: Well developed, in no acute distress  MMSE - Mini Mental State Exam 04/24/2019 10/24/2018 03/25/2018  Orientation to time 1 1 0  Orientation to Place 0 1 3  Registration 3 3 3   Attention/ Calculation 0 2 3  Recall 0 0 2  Language- name 2 objects 1 2 2   Language- repeat 1 0 0  Language- follow 3 step command 3 3 3   Language- read & follow direction 0 1 1  Write a  sentence 0 1 1  Copy design 0 1 1  Copy design-comments named 1 animal - -  Total score 9 15 19     Neurological examination  Mentation: Alert, oriented to season, can provide only minimal history. Follows all commands speech and language fluent Cranial nerve II-XII: Pupils were equal round reactive to light. Extraocular movements were full, visual field were full on confrontational test. Facial sensation and strength were normal. Uvula tongue midline. Head turning and shoulder shrug  were normal and symmetric. Motor: Autumn motor testing reveals 5 over 5 strength of all 4 extremities. Good symmetric motor tone is noted throughout.  Sensory: Sensory testing is intact to soft touch on all 4 extremities. No evidence of extinction is noted.  Coordination: Cerebellar testing reveals good finger-nose-finger and heel-to-shin bilaterally.  Gait and station: Gait is normal. Tandem gait is normal.   Reflexes: Deep tendon reflexes are symmetric and normal bilaterally.   DIAGNOSTIC DATA (LABS, IMAGING, TESTING) - I reviewed Lawson records, labs, notes, testing and imaging myself where available.  Lab Results  Component Value Date   WBC 6.2 08/13/2016   HGB 13.5 08/13/2016   HCT 40.5 08/13/2016   MCV 99.0 08/13/2016   PLT 203 08/13/2016      Component Value Date/Time   NA 141 08/13/2016 1053   K 3.9 08/13/2016 1053   CL 109 08/13/2016 1053   CO2 26 08/13/2016 1053   GLUCOSE 93 08/13/2016 1053  BUN 12 08/13/2016 1053   CREATININE 0.59 08/13/2016 1053   CALCIUM 9.2 08/13/2016 1053   PROT 6.1 (L) 08/13/2016 1235   ALBUMIN 3.7 08/13/2016 1235   AST 24 08/13/2016 1235   ALT 28 08/13/2016 1235   ALKPHOS 80 08/13/2016 1235   BILITOT 0.3 08/13/2016 1235   GFRNONAA >60 08/13/2016 1053   GFRAA >60 08/13/2016 1053   Lab Results  Component Value Date   CHOL  01/03/2010    168        ATP III CLASSIFICATION:  <200     mg/dL   Desirable  200-239  mg/dL   Borderline High  >=240    mg/dL   High           HDL 46 01/03/2010   LDLCALC (H) 01/03/2010    112        Total Cholesterol/HDL:CHD Risk Coronary Heart Disease Risk Table                     Men   Women  1/2 Average Risk   3.4   3.3  Average Risk       5.0   4.4  2 X Average Risk   9.6   7.1  3 X Average Risk  23.4   11.0        Use Autumn calculated Lawson Ratio above and Autumn CHD Risk Table to determine Autumn Lawson's CHD Risk.        ATP III CLASSIFICATION (LDL):  <100     mg/dL   Optimal  100-129  mg/dL   Near or Above                    Optimal  130-159  mg/dL   Borderline  160-189  mg/dL   High  >190     mg/dL   Very High   TRIG 51 01/03/2010   CHOLHDL 3.7 01/03/2010   No results found for: HGBA1C Lab Results  Component Value Date   VITAMINB12 635 03/22/2015   Lab Results  Component Value Date   TSH 1.406 03/22/2015      ASSESSMENT AND PLAN 78 y.o. year old female  has a past medical history of ADD (attention deficit disorder with hyperactivity), Asystole, 10 sec (06/20/2015), Depression with anxiety, Elevated LFTs, Esophageal reflux, Gallstone, Hematoma, Memory deficits (04/22/2015), PTSD (post-traumatic stress disorder), Skin cancer, basal cell, Varicose vein of leg, Vertigo, and Vertigo (03/2015). here with:  1. Memory disorder 2. Insomnia   She has had a decline in her memory score to 9/30. She has not been able to tolerate Namenda in Autumn past. She has had 15 lbs weight loss since last visit. We will decrease her dose of Aricept to 5 mg at bedtime, due to weight loss. Her husband indicates her appetite is good and she is eating 3 meals a day.  I have encouraged him to consider a baby monitor for her room to evaluate her sleep patterns. I have encouraged she establish with a primary care provider for routine care. If Autumn sleeping does not improve, we may consider an increase to 0.75 mg alprazolam at bedtime per Dr. Tobey Grim suggestion. For now, she will continue taking Alprazolam 0.5 mg about 30 minutes before  she goes to sleep. He has been giving her Autumn medication around 6:30 pm, but she wasn't going to sleep until around 11 pm.  She will follow-up in 6 months or sooner if needed.  I advised that  if her symptoms worsen or she develops any new symptoms she should let us know.   I spent 15 minutes with Autumn Lawson. 50% of this time was spent discussing her plan of care.    Butler Denmark, AGNP-C, DNP 04/24/2019, 11:04 AM [facility] [address] [place], [place] [address] [phone]

## 2019-04-24 ENCOUNTER — Other Ambulatory Visit: Payer: Self-pay

## 2019-04-24 ENCOUNTER — Ambulatory Visit (INDEPENDENT_AMBULATORY_CARE_PROVIDER_SITE_OTHER): Payer: Medicare Other | Admitting: *Deleted

## 2019-04-24 ENCOUNTER — Ambulatory Visit (INDEPENDENT_AMBULATORY_CARE_PROVIDER_SITE_OTHER): Payer: Medicare Other | Admitting: Neurology

## 2019-04-24 ENCOUNTER — Encounter: Payer: Self-pay | Admitting: Neurology

## 2019-04-24 VITALS — BP 136/76 | HR 81 | Temp 96.9°F | Ht 63.0 in | Wt 121.4 lb

## 2019-04-24 DIAGNOSIS — R413 Other amnesia: Secondary | ICD-10-CM | POA: Diagnosis not present

## 2019-04-24 DIAGNOSIS — R55 Syncope and collapse: Secondary | ICD-10-CM

## 2019-04-24 LAB — CUP PACEART REMOTE DEVICE CHECK
Battery Impedance: 182 Ohm
Battery Remaining Longevity: 147 mo
Battery Voltage: 2.79 V
Brady Statistic AP VP Percent: 1 %
Brady Statistic AP VS Percent: 45 %
Brady Statistic AS VP Percent: 0 %
Brady Statistic AS VS Percent: 54 %
Date Time Interrogation Session: 20200713155214
Implantable Lead Implant Date: 20160908
Implantable Lead Implant Date: 20160908
Implantable Lead Location: 753859
Implantable Lead Location: 753860
Implantable Lead Model: 5076
Implantable Lead Model: 5076
Implantable Pulse Generator Implant Date: 20160908
Lead Channel Impedance Value: 502 Ohm
Lead Channel Impedance Value: 520 Ohm
Lead Channel Pacing Threshold Amplitude: 0.375 V
Lead Channel Pacing Threshold Amplitude: 0.625 V
Lead Channel Pacing Threshold Pulse Width: 0.4 ms
Lead Channel Pacing Threshold Pulse Width: 0.4 ms
Lead Channel Setting Pacing Amplitude: 1.5 V
Lead Channel Setting Pacing Amplitude: 2 V
Lead Channel Setting Pacing Pulse Width: 0.4 ms
Lead Channel Setting Sensing Sensitivity: 2 mV

## 2019-04-24 MED ORDER — DONEPEZIL HCL 5 MG PO TABS: 5.0000 mg | ORAL_TABLET | Freq: Every day | ORAL | 1 refills | Status: DC

## 2019-04-24 NOTE — Progress Notes (Signed)
I have read the note, and I agree with the clinical assessment and plan.  Aleece Loyd K Dorie Ohms   

## 2019-05-02 ENCOUNTER — Telehealth: Payer: Self-pay | Admitting: Neurology

## 2019-05-02 NOTE — Telephone Encounter (Signed)
I called and spoke to daughter of pt.  She was with her parents this last week, noted she is sundowning, agitated, confused starting at 1930-2000.  Constant moving. The xanax 0.5mg  for evening to help sleep has not helped at all.  They will be putting up camera's to monitor movements at night, has her husband goes to bed and he has no idea what she is doing, and safety issue.  She is asking if some other medication could be tried (looks like trazadone and xanax has not helped at all).  ? If increasing xanax to 0.75mg  see if helps.  (is there some regimen of meds that may help so she can sleep) restorative to both pt and her caregiver.  She is active during day.  Has caregiver M-F 9-5.  Please advise.

## 2019-05-02 NOTE — Telephone Encounter (Signed)
Pt daughter is asking for a call from RN to discuss a medication regimen for pt

## 2019-05-03 MED ORDER — ALPRAZOLAM 1 MG PO TABS: 1.0000 mg | ORAL_TABLET | Freq: Every evening | ORAL | 3 refills | Status: DC | PRN

## 2019-05-03 NOTE — Telephone Encounter (Signed)
I spoke to daughter, Benjamine Mola.  Relayed per Judson Roch, NP and Dr. Jannifer Franklin will try xanax 1mg  po qhs. (give when she goes to bed).   Will try 2 tabs of 0.5mg  tabs and see how she does.  They will let us know.   She appreciated call back.

## 2019-05-03 NOTE — Telephone Encounter (Signed)
I will place the order for Xanax 1 mg at bedtime for sleep.

## 2019-05-03 NOTE — Addendum Note (Signed)
Addended by: Suzzanne Cloud on: 05/03/2019 04:38 PM   Modules accepted: Orders

## 2019-05-03 NOTE — Telephone Encounter (Signed)
Please call the patient's husband or daughter. In the past we have tried Zoloft and trazodone for sleep. She is now on Xanax. I discussed with Dr. Jannifer Franklin. He recommends we can go up on the xanax to 1 mg at bedtime. As a reminder they should give her the medication right before she actually goes to sleep. If they would like to proceed, I can send in the new prescription.

## 2019-05-03 NOTE — Addendum Note (Signed)
Addended by: Brandon Melnick on: 05/03/2019 01:25 PM   Modules accepted: Orders

## 2019-05-08 NOTE — Progress Notes (Signed)
Remote pacemaker transmission.   

## 2019-06-21 ENCOUNTER — Telehealth: Payer: Self-pay | Admitting: *Deleted

## 2019-06-21 NOTE — Telephone Encounter (Signed)
We will fill 30 day supply, faxed response to pharmacy by [facility].

## 2019-06-21 NOTE — Telephone Encounter (Signed)
Received request for 90 day supply of alprazolam, (let me know if ok).  Just did 05-03-19 (30 day) has 3 refills left.

## 2019-07-20 ENCOUNTER — Encounter: Payer: Self-pay | Admitting: Gynecology

## 2019-07-25 ENCOUNTER — Ambulatory Visit (INDEPENDENT_AMBULATORY_CARE_PROVIDER_SITE_OTHER): Payer: Medicare Other | Admitting: *Deleted

## 2019-07-25 DIAGNOSIS — R55 Syncope and collapse: Secondary | ICD-10-CM

## 2019-07-25 DIAGNOSIS — I495 Sick sinus syndrome: Secondary | ICD-10-CM

## 2019-07-25 LAB — CUP PACEART REMOTE DEVICE CHECK
Battery Impedance: 206 Ohm
Battery Remaining Longevity: 143 mo
Battery Voltage: 2.78 V
Brady Statistic AP VP Percent: 2 %
Brady Statistic AP VS Percent: 47 %
Brady Statistic AS VP Percent: 1 %
Brady Statistic AS VS Percent: 50 %
Date Time Interrogation Session: 20201012142938
Implantable Lead Implant Date: 20160908
Implantable Lead Implant Date: 20160908
Implantable Lead Location: 753859
Implantable Lead Location: 753860
Implantable Lead Model: 5076
Implantable Lead Model: 5076
Implantable Pulse Generator Implant Date: 20160908
Lead Channel Impedance Value: 561 Ohm
Lead Channel Impedance Value: 570 Ohm
Lead Channel Pacing Threshold Amplitude: 0.375 V
Lead Channel Pacing Threshold Amplitude: 0.625 V
Lead Channel Pacing Threshold Pulse Width: 0.4 ms
Lead Channel Pacing Threshold Pulse Width: 0.4 ms
Lead Channel Setting Pacing Amplitude: 1.5 V
Lead Channel Setting Pacing Amplitude: 2 V
Lead Channel Setting Pacing Pulse Width: 0.4 ms
Lead Channel Setting Sensing Sensitivity: 2 mV

## 2019-08-04 NOTE — Progress Notes (Signed)
Remote pacemaker transmission.   

## 2019-10-24 ENCOUNTER — Ambulatory Visit (INDEPENDENT_AMBULATORY_CARE_PROVIDER_SITE_OTHER): Payer: Medicare Other | Admitting: *Deleted

## 2019-10-24 DIAGNOSIS — I495 Sick sinus syndrome: Secondary | ICD-10-CM | POA: Diagnosis not present

## 2019-10-25 LAB — CUP PACEART REMOTE DEVICE CHECK
Battery Impedance: 229 Ohm
Battery Remaining Longevity: 138 mo
Battery Voltage: 2.79 V
Brady Statistic AP VP Percent: 2 %
Brady Statistic AP VS Percent: 47 %
Brady Statistic AS VP Percent: 1 %
Brady Statistic AS VS Percent: 50 %
Date Time Interrogation Session: 20210113111244
Implantable Lead Implant Date: 20160908
Implantable Lead Implant Date: 20160908
Implantable Lead Location: 753859
Implantable Lead Location: 753860
Implantable Lead Model: 5076
Implantable Lead Model: 5076
Implantable Pulse Generator Implant Date: 20160908
Lead Channel Impedance Value: 482 Ohm
Lead Channel Impedance Value: 573 Ohm
Lead Channel Pacing Threshold Amplitude: 0.25 V
Lead Channel Pacing Threshold Amplitude: 0.5 V
Lead Channel Pacing Threshold Pulse Width: 0.4 ms
Lead Channel Pacing Threshold Pulse Width: 0.4 ms
Lead Channel Setting Pacing Amplitude: 1.5 V
Lead Channel Setting Pacing Amplitude: 2 V
Lead Channel Setting Pacing Pulse Width: 0.4 ms
Lead Channel Setting Sensing Sensitivity: 2 mV

## 2019-11-06 ENCOUNTER — Other Ambulatory Visit: Payer: Self-pay | Admitting: Neurology

## 2019-11-27 ENCOUNTER — Ambulatory Visit (INDEPENDENT_AMBULATORY_CARE_PROVIDER_SITE_OTHER): Payer: Medicare Other | Admitting: Neurology

## 2019-11-27 ENCOUNTER — Other Ambulatory Visit: Payer: Self-pay

## 2019-11-27 ENCOUNTER — Encounter: Payer: Self-pay | Admitting: Neurology

## 2019-11-27 VITALS — BP 125/78 | HR 67 | Temp 97.1°F | Ht 63.0 in | Wt 121.4 lb

## 2019-11-27 DIAGNOSIS — F329 Major depressive disorder, single episode, unspecified: Secondary | ICD-10-CM | POA: Diagnosis not present

## 2019-11-27 DIAGNOSIS — F32A Depression, unspecified: Secondary | ICD-10-CM

## 2019-11-27 DIAGNOSIS — R413 Other amnesia: Secondary | ICD-10-CM | POA: Diagnosis not present

## 2019-11-27 MED ORDER — ALPRAZOLAM 1 MG PO TABS: 1.0000 mg | ORAL_TABLET | Freq: Every evening | ORAL | 1 refills | Status: DC | PRN

## 2019-11-27 MED ORDER — DONEPEZIL HCL 5 MG PO TABS: 5.0000 mg | ORAL_TABLET | Freq: Every day | ORAL | 3 refills | Status: AC

## 2019-11-27 MED ORDER — SERTRALINE HCL 100 MG PO TABS: 150.0000 mg | ORAL_TABLET | Freq: Every day | ORAL | 3 refills | Status: AC

## 2019-11-27 NOTE — Progress Notes (Signed)
I have read the note, and I agree with the clinical assessment and plan.  Ridhi Hoffert K Patryce Depriest   

## 2019-11-27 NOTE — Progress Notes (Signed)
PATIENT: Autumn Lawson DOB: 1941-01-29  REASON FOR VISIT: follow up HISTORY FROM: patient  HISTORY OF PRESENT ILLNESS: Today 11/27/19  Autumn Lawson is an 79 year old female with history of progressive memory disorder consistent with Alzheimer's disease.  She remains on Aricept, cannot tolerate Namenda due to dizziness.  After last visit, her dose of Aricept was reduced to 5 mg due to weight loss.  Today, her weight remains the same.  She has a caregiver, Collie Siad, she comes Monday to Friday about 8 hours a day.  The patient requires assistance with ADLs, now needs help to do everything with showering.  She likes to watch TV, she no longer enjoys doing puzzles, or coloring.  She is rarely agitated, is overall pleasant, seems happy.  For the last several weeks, has had more anxiety in the afternoon between 2 and 4:00.  There have been 2 episodes, which she has wandered, most recent was about a week ago, she sustained a suspected fall, has had sore ribs.  Her husband has now installed dead bolts on all the doors.  For the last week or so, she reports foul odor with urine.  Her appetite comes and goes.  She is now sleeping in the same bed as her husband, she seems to be sleeping well.  She remains on Xanax, is taking 0.5 mg at bedtime, 1 mg made her a little too sleepy.  She goes with her daughter every 3 weeks to get her nails done.  She presents today for evaluation accompanied by her husband and caregiver.  HISTORY 04/24/2019 SS: Autumn Lawson is an 79 year old female with history of progressive memory disorder consistent with Alzheimer's disease.  In April 2020 she was started on trazodone for trouble sleeping, but it was not beneficial and she was switched to alprazolam.  Her last MMSE in January 2020 was 15/30.  She remains on Aricept, cannot tolerate Namenda in the past due to side effect of dizziness.  She was residing at [facility] assisted living, however she was unable to have her dog, so her husband  now cares for her in their home.  She presents today for follow-up accompanied by her husband.  He indicates, he has not seen much change in her sleep with the addition of alprazolam.  He reports she may go into the bedroom around 11 PM, she will start folding clothes.  He is not sure exactly what time she goes to bed.  He himself will go to bed, he will check on her during the night, and she has been sleeping.  She will sleep till about 9 AM, and her caregiver will get her ready for the day.  They have been doing well with the transition to home care.  Her husband has an aide who comes Monday through Friday.  He does indicate since her last visit, she has lost 15 pounds.  He indicates she has a good appetite, she eats 3 meals a day. She presents for follow-up accompanied by her husband.   REVIEW OF SYSTEMS: Out of a complete 14 system review of symptoms, the patient complains only of the following symptoms, and all other reviewed systems are negative.  Memory loss  ALLERGIES: Allergies  Allergen Reactions  . Lactose Intolerance (Gi) Other (See Comments)    Gas and bloating  . Levaquin [Levofloxacin In D5w] Swelling and Other (See Comments)    Joint swelling, flu- like symptoms  . Namenda [Memantine Hcl]     Dizzy  HOME MEDICATIONS: Outpatient Medications Prior to Visit  Medication Sig Dispense Refill  . acetaminophen (TYLENOL) 325 MG tablet Take 650 mg by mouth every 6 (six) hours as needed. Per AL MAR, can take every 3 hours as needed, pain    . ALPRAZolam (XANAX) 1 MG tablet Take 1 tablet (1 mg total) by mouth at bedtime as needed. Please call [phone] to schedule appt to continue refills. (Patient taking differently: Take 0.5 mg by mouth daily. ) 30 tablet 0  . docusate sodium (COLACE) 100 MG capsule Take 100 mg by mouth at bedtime.    . donepezil (ARICEPT) 5 MG tablet Take 1 tablet (5 mg total) by mouth at bedtime. Please call [phone] to schedule appt. 90 tablet 0  . ibuprofen  (ADVIL,MOTRIN) 200 MG tablet Take 400 mg by mouth every 6 (six) hours as needed. Per LA MAR, may take every 3 hours as needed    . loratadine (CLARITIN) 10 MG tablet Take 10 mg by mouth daily.    . meclizine (ANTIVERT) 12.5 MG tablet Take 1 tablet (12.5 mg total) by mouth 3 (three) times daily as needed for dizziness. 90 tablet 3  . polyethylene glycol (MIRALAX / GLYCOLAX) packet Take 17 g by mouth daily as needed for mild constipation.    . sertraline (ZOLOFT) 100 MG tablet Take 1.5 tablets (150 mg total) by mouth daily. 135 tablet 3   No facility-administered medications prior to visit.    PAST MEDICAL HISTORY: Past Medical History:  Diagnosis Date  . ADD (attention deficit disorder with hyperactivity)   . Asystole, 10 sec 06/20/2015  . Depression with anxiety   . Elevated LFTs   . Esophageal reflux   . Gallstone   . Hematoma   . Memory deficits 04/22/2015  . PTSD (post-traumatic stress disorder)   . Skin cancer, basal cell   . Varicose vein of leg    bilateral  . Vertigo   . Vertigo 03/2015    PAST SURGICAL HISTORY: Past Surgical History:  Procedure Laterality Date  . ABDOMINAL HYSTERECTOMY     TAH BSO  . BACK SURGERY     rod in back placed  . CATARACT EXTRACTION     bilateral  . CESAREAN SECTION     x4  . CHOLECYSTECTOMY  1981  . EP IMPLANTABLE DEVICE N/A 06/20/2015   Procedure: Pacemaker Implant;  Surgeon: Evans Lance, MD;  Location: [facility];  Service: Cardiovascular;  Laterality: N/A;  . LIVER SURGERY  1984  . MOHS SURGERY     x 3 on face  . VARICOSE VEIN SURGERY      FAMILY HISTORY: Family History  Problem Relation Age of Onset  . Leukemia Father   . Alzheimer's disease Mother   . Heart failure Sister   . Healthy Brother   . Healthy Sister   . Healthy Sister   . Healthy Brother   . Prostate cancer Other        paternal [place] grandfather    SOCIAL HISTORY: Social History   Socioeconomic History  . Marital status: Married    Spouse name:  Not on file  . Number of children: 4  . Years of education: college  . Highest education level: Not on file  Occupational History  . Occupation: retired rn    Comment: RN  Tobacco Use  . Smoking status: Former Smoker    Quit date: 05/14/1976    Years since quitting: 43.5  . Smokeless tobacco: Never Used  Substance and Sexual Activity  . Alcohol use: Yes    Alcohol/week: 7.0 standard drinks    Types: 7 Glasses of wine per week    Comment: wine with dinner  . Drug use: No  . Sexual activity: Not Currently  Other Topics Concern  . Not on file  Social History Narrative   Patient drinks 3 cups of caffeine daily.   Patient is right handed.   Social Determinants of Health   Financial Resource Strain:   . Difficulty of Paying Living Expenses: Not on file  Food Insecurity:   . Worried About Charity fundraiser in the Last Year: Not on file  . Ran Out of Food in the Last Year: Not on file  Transportation Needs:   . Lack of Transportation (Medical): Not on file  . Lack of Transportation (Non-Medical): Not on file  Physical Activity:   . Days of Exercise per Week: Not on file  . Minutes of Exercise per Session: Not on file  Stress:   . Feeling of Stress : Not on file  Social Connections:   . Frequency of Communication with Friends and Family: Not on file  . Frequency of Social Gatherings with Friends and Family: Not on file  . Attends Religious Services: Not on file  . Active Member of Clubs or Organizations: Not on file  . Attends Archivist Meetings: Not on file  . Marital Status: Not on file  Intimate Partner Violence:   . Fear of Current or Ex-Partner: Not on file  . Emotionally Abused: Not on file  . Physically Abused: Not on file  . Sexually Abused: Not on file   PHYSICAL EXAM  Vitals:   11/27/19 0802  BP: 125/78  Pulse: 67  Temp: (!) 97.1 F (36.2 C)  TempSrc: Oral  Weight: 121 lb 6.4 oz (55.1 kg)  Height: 5\' 3"  (1.6 m)   Body mass index is 21.51  kg/m.  Generalized: Well developed, in no acute distress  MMSE - Mini Mental State Exam 04/24/2019 10/24/2018 03/25/2018  Orientation to time 1 1 0  Orientation to Place 0 1 3  Registration 3 3 3   Attention/ Calculation 0 2 3  Recall 0 0 2  Language- name 2 objects 1 2 2   Language- repeat 1 0 0  Language- follow 3 step command 3 3 3   Language- read & follow direction 0 1 1  Write a sentence 0 1 1  Copy design 0 1 1  Copy design-comments named 1 animal - -  Total score 9 15 19     Neurological examination  Mentation: Alert, history is provided by her caregiver and husband.  She is able to tell me her name, and her husband.  Follows most exam commands, smiling, speech is clear Cranial nerve II-XII: Pupils were equal round reactive to light. Extraocular movements were full, visual field were full on confrontational test. Facial sensation and strength were normal. Head turning and shoulder shrug  were normal and symmetric. Motor: Good strength of all extremities Sensory: Sensory testing is intact to soft touch on all 4 extremities. No evidence of extinction is noted.  Coordination: Cerebellar testing reveals good finger-nose-finger bilaterally Gait and station: Slow to rise from seated position, gait is slow, cautious, no assistive device Reflexes: Deep tendon reflexes are symmetric and normal bilaterally.   DIAGNOSTIC DATA (LABS, IMAGING, TESTING) - I reviewed patient records, labs, notes, testing and imaging myself where available.  Lab Results  Component Value Date  WBC 6.2 08/13/2016   HGB 13.5 08/13/2016   HCT 40.5 08/13/2016   MCV 99.0 08/13/2016   PLT 203 08/13/2016      Component Value Date/Time   NA 141 08/13/2016 1053   K 3.9 08/13/2016 1053   CL 109 08/13/2016 1053   CO2 26 08/13/2016 1053   GLUCOSE 93 08/13/2016 1053   BUN 12 08/13/2016 1053   CREATININE 0.59 08/13/2016 1053   CALCIUM 9.2 08/13/2016 1053   PROT 6.1 (L) 08/13/2016 1235   ALBUMIN 3.7 08/13/2016  1235   AST 24 08/13/2016 1235   ALT 28 08/13/2016 1235   ALKPHOS 80 08/13/2016 1235   BILITOT 0.3 08/13/2016 1235   GFRNONAA >60 08/13/2016 1053   GFRAA >60 08/13/2016 1053   Lab Results  Component Value Date   CHOL  01/03/2010    168        ATP III CLASSIFICATION:  <200     mg/dL   Desirable  200-239  mg/dL   Borderline High  >=240    mg/dL   High          HDL 46 01/03/2010   LDLCALC (H) 01/03/2010    112        Total Cholesterol/HDL:CHD Risk Coronary Heart Disease Risk Table                     Men   Women  1/2 Average Risk   3.4   3.3  Average Risk       5.0   4.4  2 X Average Risk   9.6   7.1  3 X Average Risk  23.4   11.0        Use the calculated Patient Ratio above and the CHD Risk Table to determine the patient's CHD Risk.        ATP III CLASSIFICATION (LDL):  <100     mg/dL   Optimal  100-129  mg/dL   Near or Above                    Optimal  130-159  mg/dL   Borderline  160-189  mg/dL   High  >190     mg/dL   Very High   TRIG 51 01/03/2010   CHOLHDL 3.7 01/03/2010   No results found for: HGBA1C Lab Results  Component Value Date   VITAMINB12 635 03/22/2015   Lab Results  Component Value Date   TSH 1.406 03/22/2015   ASSESSMENT AND PLAN 79 y.o. year old female  has a past medical history of ADD (attention deficit disorder with hyperactivity), Asystole, 10 sec (06/20/2015), Depression with anxiety, Elevated LFTs, Esophageal reflux, Gallstone, Hematoma, Memory deficits (04/22/2015), PTSD (post-traumatic stress disorder), Skin cancer, basal cell, Varicose vein of leg, Vertigo, and Vertigo (03/2015). here with:  1.  Memory disorder 2.  Insomnia 3. Anxiety, Depression   Her memory continues to decline, she was not able to complete a memory test today.  She will remain on lower dose of Aricept, due to weight loss, her weight has remained stable since last seen.  She will remain on Zoloft.  I will check a urine sample today, as result of increased anxiety,  report of foul urine odor.  She will remain on Xanax for sleep and anxiety.  She may take half a tablet in the afternoon if needed, continue taking 0.5 mg at bedtime.  We have discussed the need for close observation of safety, due to wandering  behavior.  She was previously in a facility, but her husband checked her out, and has been caring for her in the home.  They now have dead bolts on the doors, may also consider a door alarm. Fortunately, the have an aid who comes 5 days a week.  She will follow-up here in 6 months or sooner if needed.  I did advise if her symptoms worsen or she develops any new symptoms she should let us know.   I spent 25 minutes with the patient. 50% of this time was spent discussing her plan of care.   Butler Denmark, AGNP-C, DNP 11/27/2019, 8:14 AM [facility] [address], [place], [place] [address] [phone]

## 2019-11-27 NOTE — Patient Instructions (Signed)
You may try the Xanax taking 1/2 tablet in the afternoon if needed for anxiety, continue taking the 1/2 tablet at bedtime. Continue taking Aricept, Zoloft. Please consider safety, making sure she is not wandering. See you back in 6 months.

## 2019-12-26 ENCOUNTER — Telehealth: Payer: Self-pay | Admitting: Internal Medicine

## 2019-12-26 NOTE — Telephone Encounter (Signed)
  Pt's daughter called and would like to speak with Dr. Lovena Le regarding pt pacemaker, she has some questions.  Please call

## 2019-12-27 ENCOUNTER — Telehealth: Payer: Self-pay | Admitting: Neurology

## 2019-12-27 ENCOUNTER — Ambulatory Visit (INDEPENDENT_AMBULATORY_CARE_PROVIDER_SITE_OTHER): Payer: Medicare Other | Admitting: Internal Medicine

## 2019-12-27 ENCOUNTER — Other Ambulatory Visit: Payer: Self-pay

## 2019-12-27 VITALS — BP 110/58 | HR 80 | Ht 63.0 in | Wt 126.0 lb

## 2019-12-27 DIAGNOSIS — Z950 Presence of cardiac pacemaker: Secondary | ICD-10-CM

## 2019-12-27 DIAGNOSIS — I495 Sick sinus syndrome: Secondary | ICD-10-CM | POA: Diagnosis not present

## 2019-12-27 DIAGNOSIS — R413 Other amnesia: Secondary | ICD-10-CM

## 2019-12-27 LAB — URINALYSIS, ROUTINE W REFLEX MICROSCOPIC
Bilirubin, UA: NEGATIVE
Ketones, UA: NEGATIVE
Nitrite, UA: POSITIVE — AB
RBC, UA: NEGATIVE
Specific Gravity, UA: 1.019 (ref 1.005–1.030)
Urobilinogen, Ur: 0.2 mg/dL (ref 0.2–1.0)
pH, UA: >=9 — AB (ref 5.0–7.5)

## 2019-12-27 LAB — MICROSCOPIC EXAMINATION
Casts: NONE SEEN /lpf
Epithelial Cells (non renal): >10 /hpf — AB (ref 0–10)
RBC, Urine: >30 /hpf — AB (ref 0–2)

## 2019-12-27 MED ORDER — NITROFURANTOIN MONOHYD MACRO 100 MG PO CAPS: 100.0000 mg | ORAL_CAPSULE | Freq: Two times a day (BID) | ORAL | 0 refills | Status: DC

## 2019-12-27 NOTE — Progress Notes (Signed)
HPI Autumn Lawson returns today for followup. She is a pleasant 79 yo woman with a h/o symptomatic bradycardia due to sinus node dysfunction, s/p PPM insertion. She has dementia. She is now living at home having been taken out of assisted living. She has not fallen. She sundowns in the afternoon. She has not been vaccinated. I spoke to her husband today and strongly recommended the covid vaccine. Allergies  Allergen Reactions  . Lactose Intolerance (Gi) Other (See Comments)    Gas and bloating  . Levaquin [Levofloxacin In D5w] Swelling and Other (See Comments)    Joint swelling, flu- like symptoms  . Namenda [Memantine Hcl]     Dizzy     Current Outpatient Medications  Medication Sig Dispense Refill  . acetaminophen (TYLENOL) 325 MG tablet Take 650 mg by mouth every 6 (six) hours as needed. Per AL MAR, can take every 3 hours as needed, pain    . ALPRAZolam (XANAX) 1 MG tablet Take 1 tablet (1 mg total) by mouth at bedtime as needed for anxiety or sleep. Please call [phone] to schedule appt to continue refills. 30 tablet 1  . docusate sodium (COLACE) 100 MG capsule Take 100 mg by mouth at bedtime.    . donepezil (ARICEPT) 5 MG tablet Take 1 tablet (5 mg total) by mouth at bedtime. 90 tablet 3  . ibuprofen (ADVIL,MOTRIN) 200 MG tablet Take 400 mg by mouth every 6 (six) hours as needed. Per LA MAR, may take every 3 hours as needed    . loratadine (CLARITIN) 10 MG tablet Take 10 mg by mouth daily.    . meclizine (ANTIVERT) 12.5 MG tablet Take 1 tablet (12.5 mg total) by mouth 3 (three) times daily as needed for dizziness. 90 tablet 3  . nitrofurantoin, macrocrystal-monohydrate, (MACROBID) 100 MG capsule Take 1 capsule (100 mg total) by mouth 2 (two) times daily. X 5 days 10 capsule 0  . polyethylene glycol (MIRALAX / GLYCOLAX) packet Take 17 g by mouth daily as needed for mild constipation.    . sertraline (ZOLOFT) 100 MG tablet Take 1.5 tablets (150 mg total) by mouth daily. 135 tablet  3   No current facility-administered medications for this visit.     Past Medical History:  Diagnosis Date  . ADD (attention deficit disorder with hyperactivity)   . Asystole, 10 sec 06/20/2015  . Depression with anxiety   . Elevated LFTs   . Esophageal reflux   . Gallstone   . Hematoma   . Memory deficits 04/22/2015  . PTSD (post-traumatic stress disorder)   . Skin cancer, basal cell   . Varicose vein of leg    bilateral  . Vertigo   . Vertigo 03/2015    ROS:   All systems reviewed and negative except as noted in the HPI.   Past Surgical History:  Procedure Laterality Date  . ABDOMINAL HYSTERECTOMY     TAH BSO  . BACK SURGERY     rod in back placed  . CATARACT EXTRACTION     bilateral  . CESAREAN SECTION     x4  . CHOLECYSTECTOMY  1981  . EP IMPLANTABLE DEVICE N/A 06/20/2015   Procedure: Pacemaker Implant;  Surgeon: Evans Lance, MD;  Location: [facility];  Service: Cardiovascular;  Laterality: N/A;  . LIVER SURGERY  1984  . MOHS SURGERY     x 3 on face  . VARICOSE VEIN SURGERY       Family History  Problem Relation Age of Onset  . Leukemia Father   . Alzheimer's disease Mother   . Heart failure Sister   . Healthy Brother   . Healthy Sister   . Healthy Sister   . Healthy Brother   . Prostate cancer Other        paternal [place] grandfather     Social History   Socioeconomic History  . Marital status: Married    Spouse name: Not on file  . Number of children: 4  . Years of education: college  . Highest education level: Not on file  Occupational History  . Occupation: retired rn    Comment: RN  Tobacco Use  . Smoking status: Former Smoker    Quit date: 05/14/1976    Years since quitting: 43.6  . Smokeless tobacco: Never Used  Substance and Sexual Activity  . Alcohol use: Yes    Alcohol/week: 7.0 standard drinks    Types: 7 Glasses of wine per week    Comment: wine with dinner  . Drug use: No  . Sexual activity: Not Currently  Other  Topics Concern  . Not on file  Social History Narrative   Patient drinks 3 cups of caffeine daily.   Patient is right handed.   Social Determinants of Health   Financial Resource Strain:   . Difficulty of Paying Living Expenses:   Food Insecurity:   . Worried About Charity fundraiser in the Last Year:   . Arboriculturist in the Last Year:   Transportation Needs:   . Film/video editor (Medical):   Marland Kitchen Lack of Transportation (Non-Medical):   Physical Activity:   . Days of Exercise per Week:   . Minutes of Exercise per Session:   Stress:   . Feeling of Stress :   Social Connections:   . Frequency of Communication with Friends and Family:   . Frequency of Social Gatherings with Friends and Family:   . Attends Religious Services:   . Active Member of Clubs or Organizations:   . Attends Archivist Meetings:   Marland Kitchen Marital Status:   Intimate Partner Violence:   . Fear of Current or Ex-Partner:   . Emotionally Abused:   Marland Kitchen Physically Abused:   . Sexually Abused:      BP (!) 110/58   Pulse 80   Ht 5\' 3"  (1.6 m)   Wt 126 lb (57.2 kg)   BMI 22.32 kg/m   Physical Exam:  Well appearing NAD HEENT: Unremarkable Neck:  No JVD, no thyromegally Lymphatics:  No adenopathy Back:  No CVA tenderness Lungs:  Clear HEART:  Regular rate rhythm, no murmurs, no rubs, no clicks Abd:  soft, positive bowel sounds, no organomegally, no rebound, no guarding Ext:  2 plus pulses, no edema, no cyanosis, no clubbing Skin:  No rashes no nodules Neuro:  CN II through XII intact, motor grossly intact, she has little speech due to her dementia.  EKG - NSR  DEVICE  Normal device function.  See PaceArt for details.   Assess/Plan: 1. Sinus node dysfunction - she is asymptomatic, s/p PPM insertion.  2. PPM - her Medtronic DDD PM is working normally. We will recheck in person in a year.  [name].D

## 2019-12-27 NOTE — Telephone Encounter (Signed)
I agree with plan

## 2019-12-27 NOTE — Telephone Encounter (Signed)
I called the patient's husband. Yesterday she started complaining of burning, noted foul odor, cloudy. Urine does show infection. No fever or chills, she is at her baseline, slightly more confused.  I order the urine sample a month ago, they never brought one, said her symptoms stopped.  I will treat her with Macrobid 100 mg twice a day for 5 days.  I will add on urine culture.  Urine infection appears to be quite significant, should be rechecked in the next 1 to 2 weeks.  Unfortunately, she does not have a primary doctor, I will place a referral, try to get her established with somebody as soon as possible in [place]. They will keep a close eye on her. We can recheck the urine if primary care cannot be established.   Urine: 3+ leukocytes, positive nitrites, > 30 RBCs, many bacteria

## 2019-12-27 NOTE — Telephone Encounter (Signed)
Patient has apt with [facility] as a new Patient 01/17/2020 with Noah Charon NP patient has to arrive at 1:00 for a 1:30 Patient has to bring her RX's Telephone number [phone] - fax [phone] . Patient is aware of date and time and patient's daughter. Thanks Hinton Dyer.

## 2019-12-27 NOTE — Patient Instructions (Signed)
Medication Instructions:  Your physician recommends that you continue on your current medications as directed. Please refer to the Current Medication list given to you today.  Labwork: None ordered.  Testing/Procedures: None ordered.  Follow-Up: Your physician wants you to follow-up in: one year with Dr. Lovena Le.   You will receive a reminder letter in the mail two months in advance. If you don't receive a letter, please call our office to schedule the follow-up appointment.  Remote monitoring is used to monitor your Pacemaker from home. This monitoring reduces the number of office visits required to check your device to one time per year. It allows [place] to keep an eye on the functioning of your device to ensure it is working properly. You are scheduled for a device check from home on 01/23/2020. You may send your transmission at any time that day. If you have a wireless device, the transmission will be sent automatically. After your physician reviews your transmission, you will receive a postcard with your next transmission date.  Any Other Special Instructions Will Be Listed Below (If Applicable).  If you need a refill on your cardiac medications before your next appointment, please call your pharmacy.

## 2019-12-28 LAB — SPECIMEN STATUS REPORT

## 2019-12-28 NOTE — Telephone Encounter (Signed)
Spoke with daughter on March 16,2021.  Daughter had questions whether Pt's pacemaker could be turned off?   Pt has end stage dementia.  Advised per Dr. Rubbie Battiest did not turn off pacemakers.  Pt was seen in office yesterday March 17,2021 and Dr. Lovena Le spoke with Pt's husband and Pt's caregiver.  All questions regarding pacemaker were addressed.  Left message for daughter today 12/28/2019.  Advised to call this nurse if daughter had any further questions that she felt weren't addressed at yesterday's OV.  Await further needs.

## 2019-12-30 LAB — URINE CULTURE

## 2019-12-30 LAB — SPECIMEN STATUS REPORT

## 2020-01-01 ENCOUNTER — Telehealth: Payer: Self-pay | Admitting: Neurology

## 2020-01-01 MED ORDER — SULFAMETHOXAZOLE-TRIMETHOPRIM 800-160 MG PO TABS: 1.0000 | ORAL_TABLET | Freq: Two times a day (BID) | ORAL | 0 refills | Status: DC

## 2020-01-01 NOTE — Telephone Encounter (Signed)
Please call the patient. Urine culture came back as proteus hauseri, I treated her with Macrobid, which is resistant. I called the pharmacy at [facility] for guidance on antibiotic selection, spoke with pharmacist, Shirlee Limerick. Will treat with Bactrim DS 800/160 mg, twice daily x 5 days. She has allergy to Levaquin. Make sure no allergy to sulfa, explain importance of staying well hydrated with water, Bactrim can cause crystals to form otherwise, also watch for rash, GI side effects. Take all the medicine, if symptoms do not improve, contact us. She is seeing a new PCP 01/17/2020 that should manage UTI going forward.

## 2020-01-01 NOTE — Telephone Encounter (Signed)
I called patient's husband, relayed my previous recommendations for treatment of UTI. He understood, will pick it up tomorrow, he says not complaining of burning anymore.

## 2020-01-31 ENCOUNTER — Ambulatory Visit (INDEPENDENT_AMBULATORY_CARE_PROVIDER_SITE_OTHER): Payer: Medicare Other | Admitting: *Deleted

## 2020-01-31 DIAGNOSIS — I495 Sick sinus syndrome: Secondary | ICD-10-CM

## 2020-01-31 LAB — CUP PACEART REMOTE DEVICE CHECK
Battery Impedance: 230 Ohm
Battery Remaining Longevity: 142 mo
Battery Voltage: 2.78 V
Brady Statistic AP VP Percent: 1 %
Brady Statistic AP VS Percent: 21 %
Brady Statistic AS VP Percent: 3 %
Brady Statistic AS VS Percent: 74 %
Date Time Interrogation Session: 20210421115036
Implantable Lead Implant Date: 20160908
Implantable Lead Implant Date: 20160908
Implantable Lead Location: 753859
Implantable Lead Location: 753860
Implantable Lead Model: 5076
Implantable Lead Model: 5076
Implantable Pulse Generator Implant Date: 20160908
Lead Channel Impedance Value: 519 Ohm
Lead Channel Impedance Value: 558 Ohm
Lead Channel Pacing Threshold Amplitude: 0.375 V
Lead Channel Pacing Threshold Amplitude: 0.5 V
Lead Channel Pacing Threshold Pulse Width: 0.4 ms
Lead Channel Pacing Threshold Pulse Width: 0.4 ms
Lead Channel Setting Pacing Amplitude: 1.5 V
Lead Channel Setting Pacing Amplitude: 2 V
Lead Channel Setting Pacing Pulse Width: 0.4 ms
Lead Channel Setting Sensing Sensitivity: 2 mV

## 2020-02-01 NOTE — Progress Notes (Signed)
PPM Remote  

## 2020-02-27 ENCOUNTER — Ambulatory Visit (INDEPENDENT_AMBULATORY_CARE_PROVIDER_SITE_OTHER): Payer: Medicare Other | Admitting: Family Medicine

## 2020-02-27 ENCOUNTER — Other Ambulatory Visit: Payer: Self-pay

## 2020-02-27 ENCOUNTER — Encounter: Payer: Self-pay | Admitting: Family Medicine

## 2020-02-27 VITALS — BP 112/58 | HR 85 | Temp 97.7°F | Ht 63.0 in | Wt 116.4 lb

## 2020-02-27 DIAGNOSIS — I495 Sick sinus syndrome: Secondary | ICD-10-CM

## 2020-02-27 DIAGNOSIS — Z87440 Personal history of urinary (tract) infections: Secondary | ICD-10-CM

## 2020-02-27 DIAGNOSIS — Z9181 History of falling: Secondary | ICD-10-CM | POA: Diagnosis not present

## 2020-02-27 DIAGNOSIS — R413 Other amnesia: Secondary | ICD-10-CM

## 2020-02-27 NOTE — Progress Notes (Signed)
Subjective:     Patient ID: Autumn Lawson, female   DOB: 1941-09-08, 79 y.o.   MRN: [id number]  HPI   Patient is seen to establish care.  She apparently has very advanced dementia and is accompanied today by her husband and a caregiver.  Husband relates that he and his wife moved here from [place] over 2 years ago.  She was for some time in assisted living and then transitioned back to home.  Husband currently has 60 hours/week of help and they also have home health coming out with OT, PT, and nursing.  He is not sure which company is currently providing assistance.  She has history of sinus node dysfunction with past history of asystole and syncope and collapse.  She has pacemaker in place.  She has reported history of obstructive sleep apnea but has not been taking CPAP for several years.  Recent notes from neurology and cardiology reviewed.    History of multiple falls in the past and has apparently had subdural hematoma.  Husband thinks she has only fallen twice now in the past 4 years.  A lot of her falls were 5 to 10 years ago.  She is followed by cardiology and also sees neurology.  She is currently on Aricept 5 mg daily.  She has sertraline 100 mg and takes one and 1/2 tablets daily and Xanax 1 mg 1/2 tablet twice daily.  She has some behavioral disturbance.  Caregiver states that usually from about 3 to 7 PM seems to be her worst time for agitation.  They try to keep her engaged with activities.  She apparently did not tolerate higher doses Aricept as she has had some weight loss.  She apparently also had intolerance with Namenda.  Her last MMSE score was severely diminished at 9 out of 30 and this was back in July 2020.  She and her husband have been married 73 years.  They have 4 children.  One lives in [place].  Two live in [place] and one in [place].  Past Medical History:  Diagnosis Date  . ADD (attention deficit disorder with hyperactivity)   . Asystole, 10  sec 06/20/2015  . Depression with anxiety   . Elevated LFTs   . Esophageal reflux   . Gallstone   . Hematoma   . Memory deficits 04/22/2015  . PTSD (post-traumatic stress disorder)   . Skin cancer, basal cell   . Varicose vein of leg    bilateral  . Vertigo   . Vertigo 03/2015   Past Surgical History:  Procedure Laterality Date  . ABDOMINAL HYSTERECTOMY     TAH BSO  . BACK SURGERY     rod in back placed  . CATARACT EXTRACTION     bilateral  . CESAREAN SECTION     x4  . CHOLECYSTECTOMY  1981  . EP IMPLANTABLE DEVICE N/A 06/20/2015   Procedure: Pacemaker Implant;  Surgeon: Evans Lance, MD;  Location: [facility];  Service: Cardiovascular;  Laterality: N/A;  . LIVER SURGERY  1984  . MOHS SURGERY     x 3 on face  . VARICOSE VEIN SURGERY      reports that she quit smoking about 43 years ago. She has never used smokeless tobacco. She reports current alcohol use of about 7.0 standard drinks of alcohol per week. She reports that she does not use drugs. family history includes Alzheimer's disease in her mother; Healthy in her brother, brother, sister, and sister; Heart  failure in her sister; Leukemia in her father; Prostate cancer in an other family member. Allergies  Allergen Reactions  . Lactose Intolerance (Gi) Other (See Comments)    Gas and bloating  . Levaquin [Levofloxacin In D5w] Swelling and Other (See Comments)    Joint swelling, flu- like symptoms  . Namenda [Memantine Hcl]     Dizzy      Review of Systems History not obtainable from patient.  Obtained from caregiver and husband.  They are not aware of any recent dysuria.  She has been treated for UTIs in the past.  No recent fever.    Objective:   Physical Exam Vitals reviewed: Alert thin somewhat frail-appearing 79 year old female in no distress.  Constitutional:      General: She is not in acute distress.    Appearance: She is not ill-appearing.  Cardiovascular:     Rate and Rhythm: Normal rate and  regular rhythm.  Pulmonary:     Effort: Pulmonary effort is normal.     Breath sounds: Normal breath sounds.  Musculoskeletal:     Right lower leg: No edema.     Left lower leg: No edema.  Neurological:     Mental Status: She is alert.     Comments: Not oriented to person place or time.        Assessment:     #1 history of severe cognitive impairment with some associated behavioral disturbance.  #2 history of sinus node dysfunction with history of reported asystole and recurrent syncope and collapse currently stable with pacemaker  #3 history of frequent falls and high risk for falls  #4 history of recurrent UTIs.  High risk for colonization.  Culture results from 3/17 reviewed.      Plan:     -Husband will clarify with neurology if they prefer for [place] to take over her medications-sertraline, Aricept, alprazolam.  We discussed concerns for fall risk with any benzodiazepine but that has to be balanced obviously with her severe agitation.  We discussed limitations in general with many psychotropic medications for agitation and dementia and their inherent risks  -We recommended not checking urine unless she has fever or other highly suggestive symptoms of infection because of possible colonization  -We recommended routine follow-up in 3 months and sooner as needed  Eulas Post MD [facility] at [facility]

## 2020-03-15 ENCOUNTER — Telehealth: Payer: Self-pay | Admitting: Family Medicine

## 2020-03-15 NOTE — Telephone Encounter (Signed)
OK 

## 2020-03-15 NOTE — Telephone Encounter (Signed)
Okay to give orders? 

## 2020-03-15 NOTE — Telephone Encounter (Signed)
Pt had a near fall on 03/14/2020 skin tear on left elbow.  Mardene Celeste would like to have orders to clean the area with normal saline and apply xeroform and cover with dry dressing and change daily until healed.

## 2020-03-18 ENCOUNTER — Other Ambulatory Visit: Payer: Self-pay | Admitting: Neurology

## 2020-03-18 NOTE — Telephone Encounter (Signed)
Orders have been given.  

## 2020-03-28 ENCOUNTER — Telehealth: Payer: Self-pay | Admitting: Family Medicine

## 2020-03-28 NOTE — Telephone Encounter (Signed)
Erica from [facility] stated the pt fell the past Tuesday 6/15 and has a large bruise on her bottom left leg on the back side. She wanted to make the PCP aware.  She also stated that the pt is wanting the results of her recent urine sample.   Danae Chen can be reached at  [phone] Pt can be reached at [phone]

## 2020-03-29 NOTE — Telephone Encounter (Signed)
[phone] is the number for the agency for [facility]

## 2020-03-29 NOTE — Telephone Encounter (Signed)
Fyi about fall.  Do not see recent urine sample last one in march

## 2020-03-29 NOTE — Telephone Encounter (Signed)
I do not see urine either.  I also don't remember getting order for one.

## 2020-03-29 NOTE — Telephone Encounter (Signed)
Spoke to Ila and another provider ordered this test nothing further needed

## 2020-04-02 ENCOUNTER — Telehealth: Payer: Self-pay | Admitting: Family Medicine

## 2020-04-02 NOTE — Telephone Encounter (Signed)
Please advise 

## 2020-04-02 NOTE — Telephone Encounter (Signed)
Autumn Lawson also needs verbal order PT evaluation for the pt's fall last week.

## 2020-04-02 NOTE — Telephone Encounter (Signed)
Autumn Lawson with [facility] [phone] would like to know since the wound is healed can Dr. Elease Hashimoto send to discharge the wound care orders only. Also the patient urine is cloudy and would like to know if she can do a sample and [facility].  Call [place] at [phone]  Or  Caregiver - Cecille Rubin [phone] to give answer about urine sample

## 2020-04-02 NOTE — Telephone Encounter (Signed)
Orders given to start PT and stated that currently no other symptoms of uti

## 2020-04-02 NOTE — Telephone Encounter (Signed)
OK to start PT as requested.  Would check urine only if she is having symptoms such as burning, frequency, etc.

## 2020-04-03 NOTE — Telephone Encounter (Signed)
Nicki from [facility] stated the pt caregiver called them and the pt still has a strong odor to her urine and burning.    Nicki can be reached at [phone]

## 2020-04-03 NOTE — Telephone Encounter (Signed)
Please advise 

## 2020-04-04 ENCOUNTER — Telehealth: Payer: Self-pay | Admitting: Family Medicine

## 2020-04-04 NOTE — Telephone Encounter (Signed)
Spoke with the nurse Ailene Ravel and gave verbal orders as below.

## 2020-04-04 NOTE — Telephone Encounter (Signed)
Patient's husband is wanting to get orders for patient to do a urine specimen, however he wants [facility] to come out to the house and do it.  She is not very mobile and thinks she has a UTI.

## 2020-04-04 NOTE — Telephone Encounter (Signed)
See prior phone note. 

## 2020-04-04 NOTE — Telephone Encounter (Signed)
Have them check urine and culture if indicated.

## 2020-04-05 ENCOUNTER — Encounter: Payer: Self-pay | Admitting: Family Medicine

## 2020-04-10 ENCOUNTER — Telehealth: Payer: Self-pay

## 2020-04-10 ENCOUNTER — Telehealth: Payer: Self-pay | Admitting: Family Medicine

## 2020-04-10 MED ORDER — CEPHALEXIN 500 MG PO CAPS
500.0000 mg | ORAL_CAPSULE | Freq: Three times a day (TID) | ORAL | 0 refills | Status: DC
Start: 2020-04-10 — End: 2020-04-10

## 2020-04-10 MED ORDER — AMOXICILLIN-POT CLAVULANATE 875-125 MG PO TABS
1.0000 | ORAL_TABLET | Freq: Two times a day (BID) | ORAL | 0 refills | Status: DC
Start: 2020-04-10 — End: 2020-05-20

## 2020-04-10 NOTE — Telephone Encounter (Signed)
Left detailed message on [facility] voicemail letting them know we sent in medication

## 2020-04-10 NOTE — Telephone Encounter (Signed)
She slid from recliner to the floor today no injures   Following up on urine results that were faxed on the [facility]

## 2020-04-10 NOTE — Telephone Encounter (Signed)
Please advise 

## 2020-04-10 NOTE — Telephone Encounter (Signed)
Urine dipstick results reviewed.  She has many bacteria, positive nitrite, and leukocytes.  Culture showing gram-negative rods but final identification not back.  Start Keflex 500 mg 3 times daily for 7 days pending culture results

## 2020-04-10 NOTE — Telephone Encounter (Signed)
Called pharmacy and canceled keflex and sent in augmentin per dr.Burchette since culture came back resistant

## 2020-04-10 NOTE — Telephone Encounter (Signed)
Lvm letting Katherine Basset know we canceled  keflex and sent in augmentin instead due to culture showing resistant to keflex

## 2020-04-16 ENCOUNTER — Other Ambulatory Visit: Payer: Self-pay | Admitting: Neurology

## 2020-04-16 ENCOUNTER — Telehealth: Payer: Self-pay | Admitting: Family Medicine

## 2020-04-16 NOTE — Telephone Encounter (Signed)
   Per [facility] nurse, the pt is being treated for a UTI. She is not able to swallow the pill. The nurse wants to know if she can have something liquid. would you please call the nurse [facility] Maudie Mercury to advise  [phone] to advise

## 2020-04-16 NOTE — Telephone Encounter (Signed)
[facility] called stating that the husband is irate an he has been trying to get in touch with the office since Friday. The patient has a UTI and was called in Augmentin and the patient can't swallow them and the husband is wanting to know if there is an antibiotic in a liquid form that can be called in.  Please advise

## 2020-04-16 NOTE — Telephone Encounter (Signed)
May send in Augmentin suspension 250/62.5 mg / 5 ml and take TWO tsp po tid for 7 days.   You can share with them, if you would like, that I had to be out this afternoon secondary to my wife having surgery.

## 2020-04-16 NOTE — Telephone Encounter (Signed)
Pt's husband,Patrick, [place], is upset that this has been handled yet because he was told that [facility] has been calling since Friday and sent a fax regarding the UTI medication. He also said he has been calling over the weekend in regards to this. Informed pt that the weekend/after hours line faxes over what they discussed and being this was a holiday weekend the PCP is now in office has not been able to review their request due to seeing pt's in office but it is has been routed high. He stated "After five days, he is just now reviewing it. There are many things I would like to say to you, but I won't and have a great day". He hung up nothing further.

## 2020-04-16 NOTE — Telephone Encounter (Signed)
Rx called in and daughter notified.

## 2020-04-16 NOTE — Telephone Encounter (Signed)
Patient daughter was transferred to Clinic RN supervisor. Informed the daughter that PCP was out of office for rest of afternoon and I would forward the message in regards to a liquid antibiotic to another PCP since original PCP is out for the day. Patient daughter was very demanding threatened to call PCP and or his wife to get this handled. Informed daughter that she will need to give [place] until COB today to get it done. Patient daughter " my dad needs to have this Rx by suppertime."

## 2020-05-01 ENCOUNTER — Ambulatory Visit (INDEPENDENT_AMBULATORY_CARE_PROVIDER_SITE_OTHER): Payer: Medicare Other | Admitting: *Deleted

## 2020-05-01 DIAGNOSIS — I495 Sick sinus syndrome: Secondary | ICD-10-CM

## 2020-05-04 LAB — CUP PACEART REMOTE DEVICE CHECK
Battery Impedance: 229 Ohm
Battery Remaining Longevity: 141 mo
Battery Voltage: 2.78 V
Brady Statistic AP VP Percent: 1 %
Brady Statistic AP VS Percent: 26 %
Brady Statistic AS VP Percent: 2 %
Brady Statistic AS VS Percent: 71 %
Date Time Interrogation Session: 20210723143031
Implantable Lead Implant Date: 20160908
Implantable Lead Implant Date: 20160908
Implantable Lead Location: 753859
Implantable Lead Location: 753860
Implantable Lead Model: 5076
Implantable Lead Model: 5076
Implantable Pulse Generator Implant Date: 20160908
Lead Channel Impedance Value: 449 Ohm
Lead Channel Impedance Value: 588 Ohm
Lead Channel Pacing Threshold Amplitude: 0.375 V
Lead Channel Pacing Threshold Amplitude: 0.5 V
Lead Channel Pacing Threshold Pulse Width: 0.4 ms
Lead Channel Pacing Threshold Pulse Width: 0.4 ms
Lead Channel Setting Pacing Amplitude: 1.5 V
Lead Channel Setting Pacing Amplitude: 2 V
Lead Channel Setting Pacing Pulse Width: 0.4 ms
Lead Channel Setting Sensing Sensitivity: 2 mV

## 2020-05-06 NOTE — Progress Notes (Signed)
Remote pacemaker transmission.   

## 2020-05-07 ENCOUNTER — Telehealth: Payer: Self-pay | Admitting: Family Medicine

## 2020-05-07 NOTE — Telephone Encounter (Signed)
OK 

## 2020-05-07 NOTE — Telephone Encounter (Signed)
Autumn Lawson call and want a verbal order for physical Therapy evaluation.

## 2020-05-07 NOTE — Telephone Encounter (Signed)
Is it okay to give verbal orders for PT?

## 2020-05-08 NOTE — Telephone Encounter (Signed)
Verbal orders given to [facility] for PT as requested.

## 2020-05-13 LAB — CUP PACEART INCLINIC DEVICE CHECK
Brady Statistic AP VP Percent: 1.6 %
Brady Statistic AP VS Percent: 45.7 %
Brady Statistic AS VP Percent: 1.2 %
Brady Statistic AS VS Percent: 51.6 %
Date Time Interrogation Session: 20210317091907
Implantable Lead Implant Date: 20160908
Implantable Lead Implant Date: 20160908
Implantable Lead Location: 753859
Implantable Lead Location: 753860
Implantable Lead Model: 5076
Implantable Lead Model: 5076
Implantable Pulse Generator Implant Date: 20160908

## 2020-05-17 ENCOUNTER — Telehealth: Payer: Self-pay | Admitting: Family Medicine

## 2020-05-17 DIAGNOSIS — F0280 Dementia in other diseases classified elsewhere without behavioral disturbance: Secondary | ICD-10-CM

## 2020-05-17 NOTE — Telephone Encounter (Signed)
Lattie Haw with [facility] went out to visit with the pt on 05/16/2020 pts spouse is interested in the hospice referral and does not have a preference with which facility it is with.  Lattie Haw state that they would like to see if the referral could be placed before they discharge the pt on Friday 05/24/2020.  Lattie Haw would like to a response if she does not pick up you can leave a msg on her secured voice mail and she would also like for pts spouse to be contacted.

## 2020-05-20 ENCOUNTER — Ambulatory Visit (INDEPENDENT_AMBULATORY_CARE_PROVIDER_SITE_OTHER): Payer: Medicare Other | Admitting: Neurology

## 2020-05-20 ENCOUNTER — Encounter: Payer: Self-pay | Admitting: Neurology

## 2020-05-20 DIAGNOSIS — F0390 Unspecified dementia without behavioral disturbance: Secondary | ICD-10-CM | POA: Diagnosis not present

## 2020-05-20 NOTE — Telephone Encounter (Signed)
Left detailed message with Ladean Raya referral

## 2020-05-20 NOTE — Progress Notes (Signed)
PATIENT: Autumn Lawson DOB: 1941-01-11  REASON FOR VISIT: follow up HISTORY FROM: patient  HISTORY OF PRESENT ILLNESS: Today 05/20/20  Autumn Lawson is an 79 year old female history of progressive memory disturbance consistent with Alzheimer's disease. She is on low-dose Aricept, cannot tolerate Namenda due to dizziness. She lives with her husband, has a caregiver who comes 5 days a week, Lori. She is not able to complete a memory test. She is on Zoloft and Xanax. At last visit, was positive for UTI, treated with Macrobid, was then transition to Bactrim due to urine culture results. She is now established with a primary doctor, Dr. Elease Hashimoto. A referral has been sent for hospice. Has home health coming out.  Her memory and overall function continues to decline.  She sleeps well. No recent falls. Relies an assistance for ambulation.  Her mood is overall cooperative.  She sleeps well. Can initiate feeding herself.  Has had a skin cancer spot removed below her left eye. Has noted to favor the right side, sometimes lean to the right.  She has had a gradual decline, nothing sudden. No recent falls with bump to the head.  Presents today for evaluation accompanied by her husband and caregiver.  HISTORY 11/27/2019 SS: Autumn Lawson is an 79 year old female with history of progressive memory disorder consistent with Alzheimer's disease.  She remains on Aricept, cannot tolerate Namenda due to dizziness.  After last visit, her dose of Aricept was reduced to 5 mg due to weight loss.  Today, her weight remains the same.  She has a caregiver, Collie Siad, she comes Monday to Friday about 8 hours a day.  The patient requires assistance with ADLs, now needs help to do everything with showering.  She likes to watch TV, she no longer enjoys doing puzzles, or coloring.  She is rarely agitated, is overall pleasant, seems happy.  For the last several weeks, has had more anxiety in the afternoon between 2 and 4:00.  There have been  2 episodes, which she has wandered, most recent was about a week ago, she sustained a suspected fall, has had sore ribs.  Her husband has now installed dead bolts on all the doors.  For the last week or so, she reports foul odor with urine.  Her appetite comes and goes.  She is now sleeping in the same bed as her husband, she seems to be sleeping well.  She remains on Xanax, is taking 0.5 mg at bedtime, 1 mg made her a little too sleepy.  She goes with her daughter every 3 weeks to get her nails done.  She presents today for evaluation accompanied by her husband and caregiver.   REVIEW OF SYSTEMS: Out of a complete 14 system review of symptoms, the patient complains only of the following symptoms, and all other reviewed systems are negative.  Memory loss  ALLERGIES: Allergies  Allergen Reactions   Lactose Intolerance (Gi) Other (See Comments)    Gas and bloating   Levaquin [Levofloxacin In D5w] Swelling and Other (See Comments)    Joint swelling, flu- like symptoms   Namenda [Memantine Hcl]     Dizzy    HOME MEDICATIONS: Outpatient Medications Prior to Visit  Medication Sig Dispense Refill   ALPRAZolam (XANAX) 1 MG tablet TAKE ONE TABLET BY MOUTH EVERY NIGHT AT BEDTIME AS NEEDED FOR ANXIETY OR SLEEP (MUST MAKE APPOINTMENT!) 30 tablet 3   donepezil (ARICEPT) 5 MG tablet Take 1 tablet (5 mg total) by mouth at bedtime. [place]  tablet 3   polyethylene glycol (MIRALAX / GLYCOLAX) packet Take 17 g by mouth daily as needed for mild constipation.     sertraline (ZOLOFT) 100 MG tablet Take 1.5 tablets (150 mg total) by mouth daily. 135 tablet 3   acetaminophen (TYLENOL) 325 MG tablet Take 650 mg by mouth every 6 (six) hours as needed. Per AL MAR, can take every 3 hours as needed, pain     amoxicillin-clavulanate (AUGMENTIN) 875-125 MG tablet Take 1 tablet by mouth 2 (two) times daily. 14 tablet 0   docusate sodium (COLACE) 100 MG capsule Take 100 mg by mouth at bedtime.     loratadine  (CLARITIN) 10 MG tablet Take 10 mg by mouth daily.     No facility-administered medications prior to visit.    PAST MEDICAL HISTORY: Past Medical History:  Diagnosis Date   ADD (attention deficit disorder with hyperactivity)    Asystole, 10 sec 06/20/2015   Depression with anxiety    Elevated LFTs    Esophageal reflux    Gallstone    Hematoma    Memory deficits 04/22/2015   PTSD (post-traumatic stress disorder)    Skin cancer, basal cell    Varicose vein of leg    bilateral   Vertigo    Vertigo 03/2015    PAST SURGICAL HISTORY: Past Surgical History:  Procedure Laterality Date   ABDOMINAL HYSTERECTOMY     TAH BSO   BACK SURGERY     rod in back placed   CATARACT EXTRACTION     bilateral   CESAREAN SECTION     x4   CHOLECYSTECTOMY  1981   EP IMPLANTABLE DEVICE N/A 06/20/2015   Procedure: Pacemaker Implant;  Surgeon: Evans Lance, MD;  Location: [facility];  Service: Cardiovascular;  Laterality: N/A;   LIVER SURGERY  1984   MOHS SURGERY     x 3 on face   VARICOSE VEIN SURGERY      FAMILY HISTORY: Family History  Problem Relation Age of Onset   Leukemia Father    Alzheimer's disease Mother    Heart failure Sister    Healthy Brother    Healthy Sister    Healthy Sister    Healthy Brother    Prostate cancer Other        paternal [place] grandfather    SOCIAL HISTORY: Social History   Socioeconomic History   Marital status: Married    Spouse name: Not on file   Number of children: 4   Years of education: college   Highest education level: Not on file  Occupational History   Occupation: retired rn    Comment: RN  Tobacco Use   Smoking status: Former Smoker    Quit date: 05/14/1976    Years since quitting: 44.0   Smokeless tobacco: Never Used  Vaping Use   Vaping Use: Never used  Substance and Sexual Activity   Alcohol use: Yes    Alcohol/week: 7.0 standard drinks    Types: 7 Glasses of wine per week     Comment: wine with dinner   Drug use: No   Sexual activity: Not Currently  Other Topics Concern   Not on file  Social History Narrative   Patient drinks 3 cups of caffeine daily.   Patient is right handed.   Social Determinants of Health   Financial Resource Strain:    Difficulty of Paying Living Expenses:   Food Insecurity:    Worried About Charity fundraiser in  the Last Year:    Arboriculturist in the Last Year:   Transportation Needs:    Film/video editor (Medical):    Lack of Transportation (Non-Medical):   Physical Activity:    Days of Exercise per Week:    Minutes of Exercise per Session:   Stress:    Feeling of Stress :   Social Connections:    Frequency of Communication with Friends and Family:    Frequency of Social Gatherings with Friends and Family:    Attends Religious Services:    Active Member of Clubs or Organizations:    Attends Archivist Meetings:    Marital Status:   Intimate Partner Violence:    Fear of Current or Ex-Partner:    Emotionally Abused:    Physically Abused:    Sexually Abused:    PHYSICAL EXAM  Vitals:   05/20/20 1311  BP: 118/72  Pulse: 86   There is no height or weight on file to calculate BMI.  Generalized: Well developed, in no acute distress   Neurological examination  Mentation: Alert, smiling, limited verbal response, moderate difficulty with exam commands, history is provided by husband and caregiver Cranial nerve II-XII: Left pupil smaller than right, right is 3 mm, left is 1.5, reactive to light. Extraocular movements were full, visual field were full on confrontational test. Facial sensation and strength were normal. Head turning and shoulder shrug  were normal and symmetric. Motor: Difficulty following exam commands to assess strength, however no significant muscle weakness was noted, was noted to move all extremities on room Sensory: Sensory testing is intact to soft touch on all 4  extremities. No evidence of extinction is noted.  Coordination: Unable to perform Gait and station: Was able to ambulate in the exam room with assistance, gait is wide-based, cautious, very slow, relies on examiner  Reflexes: Deep tendon reflexes are symmetric but decreased throughout  DIAGNOSTIC DATA (LABS, IMAGING, TESTING) - I reviewed patient records, labs, notes, testing and imaging myself where available.  Lab Results  Component Value Date   WBC 6.2 08/13/2016   HGB 13.5 08/13/2016   HCT 40.5 08/13/2016   MCV 99.0 08/13/2016   PLT 203 08/13/2016      Component Value Date/Time   NA 141 08/13/2016 1053   K 3.9 08/13/2016 1053   CL 109 08/13/2016 1053   CO2 26 08/13/2016 1053   GLUCOSE 93 08/13/2016 1053   BUN 12 08/13/2016 1053   CREATININE 0.59 08/13/2016 1053   CALCIUM 9.2 08/13/2016 1053   PROT 6.1 (L) 08/13/2016 1235   ALBUMIN 3.7 08/13/2016 1235   AST 24 08/13/2016 1235   ALT 28 08/13/2016 1235   ALKPHOS 80 08/13/2016 1235   BILITOT 0.3 08/13/2016 1235   GFRNONAA >60 08/13/2016 1053   GFRAA >60 08/13/2016 1053   Lab Results  Component Value Date   CHOL  01/03/2010    168        ATP III CLASSIFICATION:  <200     mg/dL   Desirable  200-239  mg/dL   Borderline High  >=240    mg/dL   High          HDL 46 01/03/2010   LDLCALC (H) 01/03/2010    112        Total Cholesterol/HDL:CHD Risk Coronary Heart Disease Risk Table                     Men   Women  1/2  Average Risk   3.4   3.3  Average Risk       5.0   4.4  2 X Average Risk   9.6   7.1  3 X Average Risk  23.4   11.0        Use the calculated Patient Ratio above and the CHD Risk Table to determine the patient's CHD Risk.        ATP III CLASSIFICATION (LDL):  <100     mg/dL   Optimal  100-129  mg/dL   Near or Above                    Optimal  130-159  mg/dL   Borderline  160-189  mg/dL   High  >190     mg/dL   Very High   TRIG 51 01/03/2010   CHOLHDL 3.7 01/03/2010   No results found for:  HGBA1C Lab Results  Component Value Date   VITAMINB12 635 03/22/2015   Lab Results  Component Value Date   TSH 1.406 03/22/2015   ASSESSMENT AND PLAN 79 y.o. year old female  has a past medical history of ADD (attention deficit disorder with hyperactivity), Asystole, 10 sec (06/20/2015), Depression with anxiety, Elevated LFTs, Esophageal reflux, Gallstone, Hematoma, Memory deficits (04/22/2015), PTSD (post-traumatic stress disorder), Skin cancer, basal cell, Varicose vein of leg, Vertigo, and Vertigo (03/2015). here with:  1.  Dementia 2.  Insomnia 3.  Anxiety, depression  She is unable to complete a memory test, her memory and overall function continues to decline.  Of note, left pupil appears smaller than the right, there is some report of tendency to lean to the right since around June. No significant weakness was detected on exam, however patient has difficulty following exam commands for a complete assessment.  At this point, the husband does not wish to pursue MRI or CT for further evaluation of possible stroke event.  He has pursued a hospice consult through PCP.  We will hold off on further evaluation at this time.  She will remain on Zoloft and Xanax.  He wishes for these prescriptions to come to the primary doctor in the future.  She will follow-up at this office on an as-needed basis.  I spent 30 minutes of face-to-face and non-face-to-face time with patient.  This included previsit chart review, lab review, study review, order entry, electronic health record documentation, patient education.  Butler Denmark, AGNP-C, DNP 05/20/2020, 2:48 PM [facility] [address] [place], [place] [address] [phone]

## 2020-05-20 NOTE — Telephone Encounter (Signed)
Please advise 

## 2020-05-20 NOTE — Patient Instructions (Signed)
We will continue current medications  Recommend continuing to follow through with hospice referral  Your primary doctor will continue to fill the medications going forward  Return here as needed

## 2020-05-20 NOTE — Telephone Encounter (Signed)
OK to place Hospice referral.

## 2020-05-21 NOTE — Telephone Encounter (Signed)
Autumn Lawson needs the referral sent to to any hospice care that the PCP decides. Autumn Lawson will be discharges her from [facility] because there is nothing further they can do for the pt.   Autumn Lawson can be reached at [phone]

## 2020-05-21 NOTE — Addendum Note (Signed)
Addended by: Eulas Post on: 05/21/2020 04:24 PM   Modules accepted: Orders

## 2020-05-21 NOTE — Progress Notes (Signed)
I have read the note, and I agree with the clinical assessment and plan.  Davarion Cuffee K Danitza Schoenfeldt   

## 2020-05-21 NOTE — Telephone Encounter (Signed)
Please place order for hospice

## 2020-05-22 ENCOUNTER — Telehealth: Payer: Self-pay | Admitting: Family Medicine

## 2020-05-22 NOTE — Telephone Encounter (Signed)
Please advise 

## 2020-05-22 NOTE — Telephone Encounter (Signed)
Pt is Scheduled for enrollment tomorrow and wants to know if Dr. Sinclair Ship will be the attending physician...   Bryson Ha [phone]  Please advise

## 2020-05-22 NOTE — Telephone Encounter (Signed)
yes

## 2020-05-23 NOTE — Telephone Encounter (Signed)
Autumn Lawson advised of update

## 2020-05-27 ENCOUNTER — Ambulatory Visit: Payer: Medicare Other | Admitting: Neurology

## 2020-05-29 ENCOUNTER — Ambulatory Visit: Payer: Medicare Other | Admitting: Family Medicine

## 2020-06-15 MED FILL — VANCOMYCIN HCL 250 MG/5ML S: 250 | 10 days supply | Qty: 150 | Fill #0

## 2020-07-31 ENCOUNTER — Ambulatory Visit (INDEPENDENT_AMBULATORY_CARE_PROVIDER_SITE_OTHER)

## 2020-07-31 DIAGNOSIS — I495 Sick sinus syndrome: Secondary | ICD-10-CM

## 2020-08-01 ENCOUNTER — Telehealth: Payer: Self-pay

## 2020-08-01 NOTE — Telephone Encounter (Signed)
The pt daughter Jiles Garter (dpr) has questions about the pt pacemaker. She wants to know how often is it being used? What kind of activity is on it and How often is it triggered? She left the message on the voicemail. Her number is [phone].

## 2020-08-01 NOTE — Telephone Encounter (Signed)
Called [place] back. States she spoke to Dr. Lovena Le several months ago about turning patients pacemaker off. She would like to express to Dr. Lovena Le that her mother (the patient) is in hospice care at home and she would like to speak to Dr. Lovena Le about cutting the pacemaker off. I discussed that this device does not shock the patient for therapy. States she understood but still would like to talk to Dr. Lovena Le about her concerns. Advised her I would pass this along.

## 2020-08-02 NOTE — Telephone Encounter (Signed)
Returned call to daughter.  Advised we do NOT turn off pacemakers under any circumstances.  Daughter indicates understanding.

## 2020-08-06 LAB — CUP PACEART REMOTE DEVICE CHECK
Battery Impedance: 229 Ohm
Battery Remaining Longevity: 142 mo
Battery Voltage: 2.79 V
Brady Statistic AP VP Percent: 1 %
Brady Statistic AP VS Percent: 18 %
Brady Statistic AS VP Percent: 2 %
Brady Statistic AS VS Percent: 79 %
Date Time Interrogation Session: 20211025153320
Implantable Lead Implant Date: 20160908
Implantable Lead Implant Date: 20160908
Implantable Lead Location: 753859
Implantable Lead Location: 753860
Implantable Lead Model: 5076
Implantable Lead Model: 5076
Implantable Pulse Generator Implant Date: 20160908
Lead Channel Impedance Value: 462 Ohm
Lead Channel Impedance Value: 617 Ohm
Lead Channel Pacing Threshold Amplitude: 0.25 V
Lead Channel Pacing Threshold Amplitude: 0.5 V
Lead Channel Pacing Threshold Pulse Width: 0.4 ms
Lead Channel Pacing Threshold Pulse Width: 0.4 ms
Lead Channel Setting Pacing Amplitude: 1.5 V
Lead Channel Setting Pacing Amplitude: 2 V
Lead Channel Setting Pacing Pulse Width: 0.4 ms
Lead Channel Setting Sensing Sensitivity: 2.8 mV

## 2020-08-06 NOTE — Progress Notes (Signed)
Remote pacemaker transmission.   

## 2020-08-14 ENCOUNTER — Telehealth: Payer: Self-pay

## 2020-08-14 ENCOUNTER — Telehealth: Payer: Self-pay | Admitting: Internal Medicine

## 2020-08-14 NOTE — Telephone Encounter (Signed)
Returned call to Dr. Otelia Santee.  She is wanting some device information for this Pt.  She is wanting to know if she contacted [employer] would they turn off Pt's pacemaker.  Per discussion with present [facility] rep- [facility] would NOT turn off a pacemaker.  Advised Dr. Otelia Santee.    Advised if Pt family had any further questions they should call [employer] 1800 number.    No further questions.

## 2020-08-14 NOTE — Telephone Encounter (Signed)
The pt daughter called wanting the pt pacemaker model and serial number. I gave it to her.

## 2020-08-14 NOTE — Telephone Encounter (Signed)
Dr. Otelia Santee with [facility] wanted to speak to Dr. Tanna Furry Nurse about coordination of care for the patient , with specific regards to the patients pacemaker.  She needs to know what kind of pacemaker the patient has and who the rep is.   The number provided is her personal cell phone in case she is not at her desk.

## 2020-08-15 ENCOUNTER — Telehealth: Payer: Self-pay | Admitting: Internal Medicine

## 2020-08-15 NOTE — Telephone Encounter (Signed)
I returned call to [facility]. She reports she is calling to advocate for patient. Would like to speak with Dr Lovena Le directly. I told her I would send the message to Dr Lovena Le. I did not discuss any patient information with Beth.

## 2020-08-15 NOTE — Telephone Encounter (Signed)
Beth from [facility] was calling  On behalf of the patient. She is trying to advocate for the patient and would like to speak with Dr. Lovena Le ASAP. The number provided [phone])  is a direct number to reach her

## 2020-08-16 NOTE — Telephone Encounter (Signed)
Dr. Lovena Le returned call to [facility] and advised pacemaker will not be turned off.

## 2020-08-16 NOTE — Telephone Encounter (Signed)
Beth following back up. She is requesting to speak with Dr. Lovena Le only.

## 2020-08-21 ENCOUNTER — Telehealth: Payer: Self-pay | Admitting: Internal Medicine

## 2020-08-21 NOTE — Telephone Encounter (Signed)
Pt husband called in and stated he would like to get a copy of the last transmission on 10/20 Pt Husband would like to pick it up  Best number [phone]

## 2020-08-21 NOTE — Telephone Encounter (Signed)
Please address pt spouse request for records.

## 2020-10-15 ENCOUNTER — Telehealth: Payer: Self-pay | Admitting: Family Medicine

## 2020-11-12 NOTE — Telephone Encounter (Signed)
Autumn Lawson is calling and wanted to let provider know that patient passed away at 7:04 am today. CB [phone]

## 2020-11-12 DEATH — deceased
